# Patient Record
Sex: Female | Born: 1935 | Race: White | Hispanic: No | Smoking: Never smoker
Health system: Southern US, Community
[De-identification: ages and names within clinical notes are randomized; demographics above are authoritative.]

## PROBLEM LIST (undated history)

## (undated) DIAGNOSIS — I639 Cerebral infarction, unspecified: Secondary | ICD-10-CM

## (undated) DIAGNOSIS — I10 Essential (primary) hypertension: Secondary | ICD-10-CM

## (undated) DIAGNOSIS — H269 Unspecified cataract: Secondary | ICD-10-CM

## (undated) DIAGNOSIS — K219 Gastro-esophageal reflux disease without esophagitis: Secondary | ICD-10-CM

## (undated) DIAGNOSIS — F32A Depression, unspecified: Secondary | ICD-10-CM

## (undated) DIAGNOSIS — L409 Psoriasis, unspecified: Secondary | ICD-10-CM

## (undated) DIAGNOSIS — M1990 Unspecified osteoarthritis, unspecified site: Secondary | ICD-10-CM

## (undated) DIAGNOSIS — E785 Hyperlipidemia, unspecified: Secondary | ICD-10-CM

## (undated) DIAGNOSIS — F329 Major depressive disorder, single episode, unspecified: Secondary | ICD-10-CM

## (undated) DIAGNOSIS — E079 Disorder of thyroid, unspecified: Secondary | ICD-10-CM

## (undated) HISTORY — PX: REPLACEMENT TOTAL KNEE: SUR1224

---

## 2004-09-11 ENCOUNTER — Encounter: Payer: Self-pay | Admitting: General Practice

## 2004-09-29 ENCOUNTER — Encounter: Payer: Self-pay | Admitting: General Practice

## 2004-10-10 ENCOUNTER — Ambulatory Visit: Payer: Self-pay | Admitting: Ophthalmology

## 2004-10-14 ENCOUNTER — Ambulatory Visit: Payer: Self-pay | Admitting: Ophthalmology

## 2005-01-15 ENCOUNTER — Ambulatory Visit: Payer: Self-pay | Admitting: Unknown Physician Specialty

## 2005-02-02 ENCOUNTER — Ambulatory Visit: Payer: Self-pay | Admitting: Unknown Physician Specialty

## 2006-01-15 ENCOUNTER — Ambulatory Visit: Payer: Self-pay | Admitting: Unknown Physician Specialty

## 2006-12-09 ENCOUNTER — Ambulatory Visit: Payer: Self-pay | Admitting: Unknown Physician Specialty

## 2007-11-24 ENCOUNTER — Inpatient Hospital Stay: Payer: Self-pay | Admitting: Unknown Physician Specialty

## 2007-11-24 ENCOUNTER — Other Ambulatory Visit: Payer: Self-pay

## 2008-01-19 ENCOUNTER — Encounter: Admission: RE | Admit: 2008-01-19 | Discharge: 2008-01-19 | Payer: Self-pay | Admitting: Surgery

## 2008-04-20 ENCOUNTER — Ambulatory Visit (HOSPITAL_COMMUNITY): Admission: RE | Admit: 2008-04-20 | Discharge: 2008-04-21 | Payer: Self-pay | Admitting: Surgery

## 2008-04-20 ENCOUNTER — Encounter (INDEPENDENT_AMBULATORY_CARE_PROVIDER_SITE_OTHER): Payer: Self-pay | Admitting: Surgery

## 2008-06-27 ENCOUNTER — Inpatient Hospital Stay: Payer: Self-pay | Admitting: Internal Medicine

## 2008-12-05 ENCOUNTER — Ambulatory Visit: Payer: Self-pay | Admitting: Ophthalmology

## 2008-12-18 ENCOUNTER — Ambulatory Visit: Payer: Self-pay | Admitting: Ophthalmology

## 2009-01-03 ENCOUNTER — Ambulatory Visit: Payer: Self-pay | Admitting: Unknown Physician Specialty

## 2010-06-07 ENCOUNTER — Emergency Department: Payer: Self-pay | Admitting: Emergency Medicine

## 2010-12-09 NOTE — Op Note (Signed)
NAMEMISTINA, COATNEY NO.:  [id number]   MEDICAL RECORD NO.:  [id number]          PATIENT TYPE:  AMB   LOCATION:  DAY                          FACILITY:  [facility]   PHYSICIAN:  Velora Heckler, MD      DATE OF BIRTH:  1921-01-16   DATE OF PROCEDURE:  04/20/2008  DATE OF DISCHARGE:                               OPERATIVE REPORT   PREOPERATIVE DIAGNOSES:  1. Thyroid goiter.  2. Hyperthyroidism.   POSTOPERATIVE DIAGNOSES:  1. Thyroid goiter.  2. Hyperthyroidism.   PROCEDURE:  Total thyroidectomy.   SURGEON:  Velora Heckler, M.D., FACS   ASSISTANT:  Ovidio Kin, M.D., FACS  Manus Rudd, M.D., FACS   ANESTHESIA:  General per Dr. Lestine Box.   ESTIMATED BLOOD LOSS:  Minimal.   PREPARATION:  Betadine.   COMPLICATIONS:  None.   INDICATIONS:  The patient is an 77 year old white female from  [place], [place].  Incidental chest x-ray in April 2009 showed  significant tracheal deviation.  CT scan of the chest and [facility] showed a substernal goiter with deviation of the  trachea to the left.  Laboratory studies showed a suppressed TSH level  of 0.219.  The patient was seen by her primary physician and referred to  surgery for evaluation.  Ultrasound examination showed a dominant mass  in the left thyroid lobe measuring 7.3 cm in size.  After discussion  with the patient and family, decision was made to proceed with total  thyroidectomy.   DESCRIPTION OF PROCEDURE:  Procedure is done in OR #6 at the [facility].  The patient is brought to the operating room,  placed in supine position on the operating room table.  Following  administration of general anesthesia, the patient is positioned and then  prepped and draped in usual strict aseptic fashion.  After ascertaining  that an adequate level of anesthesia been achieved, a Kocher incision is  made with a #15 blade.  Dissection is carried through  subcutaneous  tissues and platysma.  Hemostasis is obtained with electrocautery.  Skin  flaps are elevated cephalad and caudad from the thyroid notch to the  sternal notch.  A Mahorner self-retaining retractor is placed for  exposure.  Strap muscles are incised in the midline and dissection is  begun on the left side of the neck.  Left thyroid lobe is goiterous.  It  extends well beneath the clavicle on the left.  It deviates the trachea  to the right.  With gentle blunt dissection, it is mobilized.  Venous  tributaries are divided between medium Ligaclips with the Harmonic  scalpel.  Gland is delivered out of the mediastinum and up into the  neck.  Great care is taken to dissect on the capsule of the gland.  Both  the superior and inferior parathyroid glands are identified and  preserved on their vascular pedicle.  Recurrent nerve is identified and  preserved.  Branches of the inferior thyroid artery are divided between  small and medium Ligaclips with the Harmonic scalpel.  Superior pole  vessels are divided  between medium Ligaclips with the Harmonic scalpel.  Gland is rolled further anteriorly.  Ligament of Allyson Sabal is transected  with electrocautery and the gland is rolled up and onto the anterior  trachea.  Isthmus is mobilized across the midline.  A small pyramidal  lobe is mobilized with electrocautery and included with resection.  A  dry pack is placed in the left neck.   Next we turned our attention to the right side.  Again strap muscles are  reflected laterally.  The right lobe is exposed.  The right thyroid lobe  is normal size but there are two nodules present, one in the superior  pole and one in the inferior pole, each of which measure approximately 1  cm.  Inferior venous tributaries are divided between small Ligaclips  with the Harmonic scalpel.  Superior pole vessels are mobilized and  divided between medium Ligaclips with the Harmonic scalpel.  Gland is  rolled  anteriorly.  Both the superior and inferior parathyroid glands  are identified and preserved on their vascular pedicle.  Branches of the  inferior thyroid artery are divided between small Ligaclips.  Recurrent  nerve is identified and preserved.  Ligament of Allyson Sabal is transected with  electrocautery and the gland is excised off the anterior trachea.  A  suture is used to mark the right superior pole.  The entire specimen is  submitted to pathology for review.  Neck is irrigated with warm saline  which is evacuated.  Good hemostasis is noted.  Surgicel is placed in  the operative field bilaterally.  Strap muscles are reapproximated in  the midline with interrupted 3-0 Vicryl sutures.  Platysma is closed  with interrupted 3-0 Vicryl sutures.  Skin is closed with a running 4-0  Monocryl subcuticular suture.  Wound is washed and dried and Benzoin and  Steri-Strips are applied.  Sterile dressings are applied.  The patient  is awakened from anesthesia and brought the recovery room in stable  condition.  The patient tolerated the procedure well.      Velora Heckler, MD  Electronically Signed     TMG/MEDQ  D:  04/20/2008  T:  04/21/2008  Job:  [id number]   cc:   Francia Greaves, M.D.

## 2011-04-01 ENCOUNTER — Ambulatory Visit: Payer: Self-pay | Admitting: Family Medicine

## 2011-04-27 LAB — URINALYSIS, ROUTINE W REFLEX MICROSCOPIC
Bilirubin Urine: NEGATIVE
Ketones, ur: NEGATIVE
Nitrite: NEGATIVE
Protein, ur: NEGATIVE
Urobilinogen, UA: 0.2

## 2011-04-27 LAB — DIFFERENTIAL
Basophils Absolute: 0.1
Basophils Relative: 1
Eosinophils Absolute: 0.2
Lymphs Abs: 1.5
Neutrophils Relative %: 60

## 2011-04-27 LAB — COMPREHENSIVE METABOLIC PANEL
ALT: 23
CO2: 27
Calcium: 9.2
GFR calc non Af Amer: >60
Glucose, Bld: 106 — ABNORMAL HIGH
Sodium: 139
Total Bilirubin: 0.8

## 2011-04-27 LAB — CBC
Hemoglobin: 13.7
MCHC: 34.5
MCV: 91.8
RBC: 4.33

## 2011-04-27 LAB — PROTIME-INR
INR: 1
Prothrombin Time: 13.2

## 2011-04-27 LAB — CALCIUM
Calcium: 8.3 — ABNORMAL LOW
Calcium: 8.5

## 2015-11-20 ENCOUNTER — Emergency Department: Payer: Medicare Other

## 2015-11-20 ENCOUNTER — Emergency Department
Admission: EM | Admit: 2015-11-20 | Discharge: 2015-11-20 | Disposition: A | Discharge status: Home / Self Care | Payer: Medicare Other | Attending: Emergency Medicine | Admitting: Emergency Medicine

## 2015-11-20 ENCOUNTER — Encounter: Payer: Self-pay | Admitting: Emergency Medicine

## 2015-11-20 DIAGNOSIS — R0602 Shortness of breath: Secondary | ICD-10-CM | POA: Insufficient documentation

## 2015-11-20 DIAGNOSIS — I10 Essential (primary) hypertension: Secondary | ICD-10-CM | POA: Diagnosis not present

## 2015-11-20 DIAGNOSIS — E785 Hyperlipidemia, unspecified: Secondary | ICD-10-CM | POA: Diagnosis not present

## 2015-11-20 DIAGNOSIS — R0600 Dyspnea, unspecified: Secondary | ICD-10-CM

## 2015-11-20 DIAGNOSIS — M549 Dorsalgia, unspecified: Secondary | ICD-10-CM | POA: Insufficient documentation

## 2015-11-20 DIAGNOSIS — M546 Pain in thoracic spine: Secondary | ICD-10-CM

## 2015-11-20 HISTORY — DX: Essential (primary) hypertension: I10

## 2015-11-20 HISTORY — DX: Hyperlipidemia, unspecified: E78.5

## 2015-11-20 HISTORY — DX: Psoriasis, unspecified: L40.9

## 2015-11-20 HISTORY — DX: Gastro-esophageal reflux disease without esophagitis: K21.9

## 2015-11-20 LAB — CBC
HCT: 42.6 % (ref 35.0–47.0)
Hemoglobin: 14.3 g/dL (ref 12.0–16.0)
MCH: 29.5 pg (ref 26.0–34.0)
MCHC: 33.6 g/dL (ref 32.0–36.0)
MCV: 87.9 fL (ref 80.0–100.0)
PLATELETS: 329 10*3/uL (ref 150–440)
RBC: 4.85 MIL/uL (ref 3.80–5.20)
RDW: 13.4 % (ref 11.5–14.5)
WBC: 12.8 10*3/uL — AB (ref 3.6–11.0)

## 2015-11-20 LAB — BASIC METABOLIC PANEL
ANION GAP: 12 (ref 5–15)
BUN: 18 mg/dL (ref 6–20)
CALCIUM: 9.5 mg/dL (ref 8.9–10.3)
CO2: 24 mmol/L (ref 22–32)
CREATININE: 0.8 mg/dL (ref 0.44–1.00)
Chloride: 99 mmol/L — ABNORMAL LOW (ref 101–111)
Glucose, Bld: 112 mg/dL — ABNORMAL HIGH (ref 65–99)
Potassium: 3.5 mmol/L (ref 3.5–5.1)
SODIUM: 135 mmol/L (ref 135–145)

## 2015-11-20 LAB — TROPONIN I: Troponin I: <0.03 ng/mL (ref <0.031)

## 2015-11-20 MED ORDER — CYCLOBENZAPRINE HCL 10 MG PO TABS
ORAL_TABLET | ORAL | Status: AC
  Administered 2015-11-20: 5 mg via ORAL
  Filled 2015-11-20: qty 1

## 2015-11-20 MED ORDER — LORAZEPAM 2 MG/ML IJ SOLN
1.0000 mg | Freq: Once | INTRAMUSCULAR | Status: AC
  Administered 2015-11-20: 1 mg via INTRAVENOUS

## 2015-11-20 MED ORDER — CYCLOBENZAPRINE HCL 10 MG PO TABS
5.0000 mg | ORAL_TABLET | Freq: Once | ORAL | Status: AC
  Administered 2015-11-20: 5 mg via ORAL

## 2015-11-20 MED ORDER — CYCLOBENZAPRINE HCL 5 MG PO TABS: 2.5000 mg | ORAL_TABLET | Freq: Three times a day (TID) | ORAL | Status: AC | PRN

## 2015-11-20 MED ORDER — LORAZEPAM 2 MG/ML IJ SOLN
INTRAMUSCULAR | Status: AC
  Administered 2015-11-20: 1 mg via INTRAVENOUS
  Filled 2015-11-20: qty 1

## 2015-11-20 MED ORDER — IOPAMIDOL (ISOVUE-370) INJECTION 76%
100.0000 mL | Freq: Once | INTRAVENOUS | Status: AC | PRN
  Administered 2015-11-20: 100 mL via INTRAVENOUS

## 2015-11-20 NOTE — ED Provider Notes (Signed)
[facility] Emergency Department Provider Note  Time seen: 4:45 PM  I have reviewed the triage vital signs and the nursing notes.   HISTORY  Chief Complaint Back Pain    HPI Alyssa King is a 82 y.o. female with a past medical history of hypertension, hyperlipidemia, gastric reflux who presents to the emergency department back pain. According to the patient and her daughter the patient has been experiencing significant mid back pain since this morning. Patient states she was attempting to get out of bed when the pain started. Describes the pain as significant. States she had a fall 2 weeks ago but states she has not had any pain until this morning. Denies any recent traumatic events. Upon arriving to the emergency department the patient is also now stating she is having trouble breathing. States it feels like her back is spasming. Denies chest pain. Denies abdominal pain. Denies nausea, vomiting, diaphoresis. Denies fever. Describes her back pain as severe, intermittent comes in waves.     Past Medical History  Diagnosis Date  . Hypertension   . Hyperlipidemia   . GERD (gastroesophageal reflux disease)   . Psoriasis     There are no active problems to display for this patient.   Past Surgical History  Procedure Laterality Date  . Replacement total knee      No current outpatient prescriptions on file.  Allergies Review of patient's allergies indicates no known allergies.  No family history on file.  Social History Social History  Substance Use Topics  . Smoking status: Never Smoker   . Smokeless tobacco: None  . Alcohol Use: Yes    Review of Systems Constitutional: Negative for fever. Cardiovascular: Negative for chest pain. Respiratory: Negative for shortness of breath. Gastrointestinal: Negative for abdominal pain Musculoskeletal: Positive for mid back pain. Skin: Negative for rash. Neurological: Negative for headache 10-point  ROS otherwise negative.  ____________________________________________   PHYSICAL EXAM:  VITAL SIGNS: ED Triage Vitals  Enc Vitals Group     BP 11/20/15 1520 178/89 mmHg     Pulse Rate 11/20/15 1520 90     Resp 11/20/15 1520 13     Temp 11/20/15 1520 98 F (36.7 C)     Temp Source 11/20/15 1520 Oral     SpO2 11/20/15 1520 94 %     Weight 11/20/15 1520 172 lb (78.019 kg)     Height 11/20/15 1520 5' 3.5" (1.613 m)     Head Cir --      Peak Flow --      Pain Score 11/20/15 1527 10     Pain Loc --      Pain Edu? --      Excl. in GC? --     Constitutional: Alert and oriented. Well appearing and in no distress. Eyes: Normal exam ENT   Head: Normocephalic and atraumatic.   Mouth/Throat: Mucous membranes are moist. Cardiovascular: Normal rate, regular rhythm. No murmur Respiratory: Normal respiratory effort without tachypnea nor retractions. Breath sounds are clear Gastrointestinal: Soft and nontender. No distention. No palpable masses. Musculoskeletal: Moderate mid back painWith palpation,  Neurologic:  Normal speech and language. No gross focal neurologic deficits Skin:  Skin is warm, dry and intact.  Psychiatric: Mood and affect are normal. Speech and behavior are normal.  ____________________________________________    EKG  EKG reviewed and interpreted by myself shows normal sinus rhythm at 87 bpm, narrow QRS, mild left axis deviation, nonspecific ST changes. No ST elevations.  ____________________________________________  RADIOLOGY  Chest x-ray shows no acute disease.  ____________________________________________   INITIAL IMPRESSION / ASSESSMENT AND PLAN / ED COURSE  Pertinent labs & imaging results that were available during my care of the patient were reviewed by me and considered in my medical decision making (see chart for details).  Patient presents the emergency department with back pain. Patient was sent for x-rays however upon returning from  x-ray she is complaining of significant shortness of breath, sitting on her side saying she cannot roll onto her back due to back pain and shortness of breath. Daughter states the patient has a history of anxiety. We'll dose 1 mg of Ativan as an anxiolytic hopefully for the patient so we can obtain a CT angiography of the patient's chest abdomen and pelvis to rule out aortic involvement. Patient's labs have resulted within normal limits besides a mild leukocytosis of 12,000. Chest x-ray is negative. Lumbar spine shows possible L5 compression fracture. Patient's pain appears to be in the upper L verse lower T-spine however.  CT angiography does not appear to show any acute abnormalities besides renal artery stenosis but patient has normal renal function. Patient's pain appears to be in the lower thoracic paraspinal region, tenderness to palpation on exam. Patient states he feels better when the area is rubbed. They state they have been doing overhead pulldown's in physical therapy this week which may have been contributory to her discomfort. Patient was able to ambulate with a walker without difficulty. Patient is wishing to be discharged home at this time. We will do a low dose muscle relaxer for the patient she is also to use Tylenol as needed for discomfort she will return to the emergency department for any further shortness of breath, or any worsening of her back pain.     ____________________________________________   FINAL CLINICAL IMPRESSION(S) / ED DIAGNOSES  Back pain Dyspnea   Minna Antis, MD 11/20/15 2015

## 2015-11-20 NOTE — ED Notes (Signed)
Patient transported to CT 

## 2015-11-20 NOTE — ED Notes (Signed)
Pt comes into the ED via EMS from [facility] c/o mid back pain that started this morning.  Patient denies abdominal pain, chest pain, or dizsiness.  Denies any trauma or falls recently. NSR, 194/94, A&Ox4.  Patient is tender upon palpation.

## 2015-11-20 NOTE — Discharge Instructions (Signed)

## 2015-11-24 ENCOUNTER — Encounter: Payer: Self-pay | Admitting: Emergency Medicine

## 2015-11-24 ENCOUNTER — Telehealth: Payer: Self-pay | Admitting: Emergency Medicine

## 2015-11-24 ENCOUNTER — Emergency Department
Admission: EM | Admit: 2015-11-24 | Discharge: 2015-11-24 | Disposition: A | Discharge status: Skilled Nursing Facility | Payer: Medicare Other | Attending: Emergency Medicine | Admitting: Emergency Medicine

## 2015-11-24 DIAGNOSIS — N390 Urinary tract infection, site not specified: Secondary | ICD-10-CM

## 2015-11-24 DIAGNOSIS — I10 Essential (primary) hypertension: Secondary | ICD-10-CM | POA: Diagnosis not present

## 2015-11-24 DIAGNOSIS — M545 Low back pain: Secondary | ICD-10-CM | POA: Diagnosis present

## 2015-11-24 DIAGNOSIS — E785 Hyperlipidemia, unspecified: Secondary | ICD-10-CM | POA: Insufficient documentation

## 2015-11-24 DIAGNOSIS — M546 Pain in thoracic spine: Secondary | ICD-10-CM | POA: Insufficient documentation

## 2015-11-24 DIAGNOSIS — Z79899 Other long term (current) drug therapy: Secondary | ICD-10-CM | POA: Insufficient documentation

## 2015-11-24 LAB — URINALYSIS COMPLETE WITH MICROSCOPIC (ARMC ONLY)
BILIRUBIN URINE: NEGATIVE
Bacteria, UA: NONE SEEN
GLUCOSE, UA: NEGATIVE mg/dL
Ketones, ur: NEGATIVE mg/dL
NITRITE: NEGATIVE
Protein, ur: NEGATIVE mg/dL
SPECIFIC GRAVITY, URINE: 1.016 (ref 1.005–1.030)
pH: 6 (ref 5.0–8.0)

## 2015-11-24 MED ORDER — LIDOCAINE 4 % EX PTCH: 1.0000 | MEDICATED_PATCH | Freq: Two times a day (BID) | CUTANEOUS | Status: DC

## 2015-11-24 MED ORDER — LIDOCAINE 5 % EX PTCH
1.0000 | MEDICATED_PATCH | CUTANEOUS | Status: DC
  Administered 2015-11-24: 1 via TRANSDERMAL
  Filled 2015-11-24 (×2): qty 1

## 2015-11-24 MED ORDER — NITROFURANTOIN MONOHYD MACRO 100 MG PO CAPS
100.0000 mg | ORAL_CAPSULE | Freq: Once | ORAL | Status: AC
  Administered 2015-11-24: 100 mg via ORAL

## 2015-11-24 MED ORDER — LIDOCAINE 5 % EX PTCH: 1.0000 | MEDICATED_PATCH | Freq: Two times a day (BID) | CUTANEOUS | Status: AC

## 2015-11-24 MED ORDER — NITROFURANTOIN MONOHYD MACRO 100 MG PO CAPS: 100.0000 mg | ORAL_CAPSULE | Freq: Two times a day (BID) | ORAL | Status: DC

## 2015-11-24 MED ORDER — NITROFURANTOIN MONOHYD MACRO 100 MG PO CAPS
ORAL_CAPSULE | ORAL | Status: AC
  Filled 2015-11-24: qty 1

## 2015-11-24 NOTE — ED Notes (Signed)
Assisted pt to toilet in room to void and collect urine specimen; pt unable to wipe well before she started to void; pt told this nurse when she arrived she walked just fine; upon arrival of daughter, this RN was informed pt actually uses a walker to ambulate; pt changed to high fall risk, yellow socks in place before getting up to toilet

## 2015-11-24 NOTE — ED Notes (Signed)
[facility]

## 2015-11-24 NOTE — ED Notes (Signed)
[facility] back spasms; seen here 4/26 for same and prescribed flexeril; pt is not sure if she was given any tonight for her spasms; pt says pain goes completely away to 0/10 and then goes up to 10/10 with spasms; denies fall; pt alert and oriented x 3; talking in complete coherent sentences

## 2015-11-24 NOTE — Discharge Instructions (Signed)
1. Take antibiotic as prescribed (Macrobid 100 mg twice daily 7 days). 2. Urine culture is pending. We will notify you if we need to change your antibiotic. 3. You may use Lidoderm term patch as needed for pain control. 4. Return to the ER for worsening symptoms, persistent vomiting, difficulty breathing or other concerns.  Urinary Tract Infection Urinary tract infections (UTIs) can develop anywhere along your urinary tract. Your urinary tract is your body's drainage system for removing wastes and extra water. Your urinary tract includes two kidneys, two ureters, a bladder, and a urethra. Your kidneys are a pair of bean-shaped organs. Each kidney is about the size of your fist. They are located below your ribs, one on each side of your spine. CAUSES Infections are caused by microbes, which are microscopic organisms, including fungi, viruses, and bacteria. These organisms are so small that they can only be seen through a microscope. Bacteria are the microbes that most commonly cause UTIs. SYMPTOMS  Symptoms of UTIs may vary by age and gender of the patient and by the location of the infection. Symptoms in young women typically include a frequent and intense urge to urinate and a painful, burning feeling in the bladder or urethra during urination. Older women and men are more likely to be tired, shaky, and weak and have muscle aches and abdominal pain. A fever may mean the infection is in your kidneys. Other symptoms of a kidney infection include pain in your back or sides below the ribs, nausea, and vomiting. DIAGNOSIS To diagnose a UTI, your caregiver will ask you about your symptoms. Your caregiver will also ask you to provide a urine sample. The urine sample will be tested for bacteria and white blood cells. White blood cells are made by your body to help fight infection. TREATMENT  Typically, UTIs can be treated with medication. Because most UTIs are caused by a bacterial infection, they usually  can be treated with the use of antibiotics. The choice of antibiotic and length of treatment depend on your symptoms and the type of bacteria causing your infection. HOME CARE INSTRUCTIONS  If you were prescribed antibiotics, take them exactly as your caregiver instructs you. Finish the medication even if you feel better after you have only taken some of the medication.  Drink enough water and fluids to keep your urine clear or pale yellow.  Avoid caffeine, tea, and carbonated beverages. They tend to irritate your bladder.  Empty your bladder often. Avoid holding urine for long periods of time.  Empty your bladder before and after sexual intercourse.  After a bowel movement, women should cleanse from front to back. Use each tissue only once. SEEK MEDICAL CARE IF:   You have back pain.  You develop a fever.  Your symptoms do not begin to resolve within 3 days. SEEK IMMEDIATE MEDICAL CARE IF:   You have severe back pain or lower abdominal pain.  You develop chills.  You have nausea or vomiting.  You have continued burning or discomfort with urination. MAKE SURE YOU:   Understand these instructions.  Will watch your condition.  Will get help right away if you are not doing well or get worse.   This information is not intended to replace advice given to you by your health care provider. Make sure you discuss any questions you have with your health care provider.   Document Released: 04/22/2005 Document Revised: 04/03/2015 Document Reviewed: 08/21/2011 Elsevier Interactive Patient Education 2016 Else[employer] Inc[employer]  Back Pain, Adult Back  pain is very common in adults.The cause of back pain is rarely dangerous and the pain often gets better over time.The cause of your back pain may not be known. Some common causes of back pain include:  Strain of the muscles or ligaments supporting the spine.  Wear and tear (degeneration) of the spinal disks.  Arthritis.  Direct injury to  the back. For many people, back pain may return. Since back pain is rarely dangerous, most people can learn to manage this condition on their own. HOME CARE INSTRUCTIONS Watch your back pain for any changes. The following actions may help to lessen any discomfort you are feeling:  Remain active. It is stressful on your back to sit or stand in one place for long periods of time. Do not sit, drive, or stand in one place for more than 30 minutes at a time. Take short walks on even surfaces as soon as you are able.Try to increase the length of time you walk each day.  Exercise regularly as directed by your health care provider. Exercise helps your back heal faster. It also helps avoid future injury by keeping your muscles strong and flexible.  Do not stay in bed.Resting more than 1-2 days can delay your recovery.  Pay attention to your body when you bend and lift. The most comfortable positions are those that put less stress on your recovering back. Always use proper lifting techniques, including:  Bending your knees.  Keeping the load close to your body.  Avoiding twisting.  Find a comfortable position to sleep. Use a firm mattress and lie on your side with your knees slightly bent. If you lie on your back, put a pillow under your knees.  Avoid feeling anxious or stressed.Stress increases muscle tension and can worsen back pain.It is important to recognize when you are anxious or stressed and learn ways to manage it, such as with exercise.  Take medicines only as directed by your health care provider. Over-the-counter medicines to reduce pain and inflammation are often the most helpful.Your health care provider may prescribe muscle relaxant drugs.These medicines help dull your pain so you can more quickly return to your normal activities and healthy exercise.  Apply ice to the injured area:  Put ice in a plastic bag.  Place a towel between your skin and the bag.  Leave the ice on  for 20 minutes, 2-3 times a day for the first 2-3 days. After that, ice and heat may be alternated to reduce pain and spasms.  Maintain a healthy weight. Excess weight puts extra stress on your back and makes it difficult to maintain good posture. SEEK MEDICAL CARE IF:  You have pain that is not relieved with rest or medicine.  You have increasing pain going down into the legs or buttocks.  You have pain that does not improve in one week.  You have night pain.  You lose weight.  You have a fever or chills. SEEK IMMEDIATE MEDICAL CARE IF:   You develop new bowel or bladder control problems.  You have unusual weakness or numbness in your arms or legs.  You develop nausea or vomiting.  You develop abdominal pain.  You feel faint.   This information is not intended to replace advice given to you by your health care provider. Make sure you discuss any questions you have with your health care provider.   Document Released: 07/13/2005 Document Revised: 08/03/2014 Document Reviewed: 11/14/2013 Elsevier Interactive Patient Education [employer]2016 [employer].

## 2015-11-24 NOTE — ED Provider Notes (Signed)
[facility] Emergency Department Provider Note   ____________________________________________  Time seen: Approximately 4:19 AM  I have reviewed the triage vital signs and the nursing notes.   HISTORY  Chief Complaint Back Pain    HPI Alyssa King is a 82 y.o. female who presents to the ED from nursing facility via EMS with a chief complaint of back spasms.Patient was seen in the ED 4 days ago and had multiple imaging studies done for the same complaint which was occurred after intensive physical therapy doing overhead pull downs. She was prescribed Flexeril and she is not sure if she was given any this evening. She was awakened prior to arrival with an intense left sided thoracic back spasm. Denies associated symptoms of fever, chills, chest pain, shortness of breath, abdominal pain, nausea, vomiting, diarrhea. Nothing makes her symptoms better or worse.   Past Medical History  Diagnosis Date  . Hypertension   . Hyperlipidemia   . GERD (gastroesophageal reflux disease)   . Psoriasis     There are no active problems to display for this patient.   Past Surgical History  Procedure Laterality Date  . Replacement total knee      Current Outpatient Rx  Name  Route  Sig  Dispense  Refill  . acetaminophen (TYLENOL) 325 MG tablet   Oral   Take 650 mg by mouth 2 (two) times daily. Pt is also able to take twice daily as needed for pain in between scheduled doses.         . ALPRAZolam (XANAX) 0.25 MG tablet   Oral   Take 0.125 mg by mouth every 8 (eight) hours as needed for anxiety.         Marland Kitchen amLODipine (NORVASC) 5 MG tablet   Oral   Take 5 mg by mouth daily.         . benazepril (LOTENSIN) 10 MG tablet   Oral   Take 10 mg by mouth daily.         . calcium carbonate (TUMS - DOSED IN MG ELEMENTAL CALCIUM) 500 MG chewable tablet   Oral   Chew 2 tablets by mouth daily.         . cholecalciferol (VITAMIN D) 1000 units tablet  Oral   Take 2,000 Units by mouth daily.         . cyclobenzaprine (FLEXERIL) 5 MG tablet   Oral   Take 0.5 tablets (2.5 mg total) by mouth every 8 (eight) hours as needed for muscle spasms.   20 tablet   0   . diclofenac sodium (VOLTAREN) 1 % GEL   Topical   Apply 2 g topically 2 (two) times daily. Pt applies to both legs.         Marland Kitchen escitalopram (LEXAPRO) 10 MG tablet   Oral   Take 10 mg by mouth daily.         Marland Kitchen levothyroxine (SYNTHROID, LEVOTHROID) 100 MCG tablet   Oral   Take 100 mcg by mouth every evening.         . loratadine (CLARITIN) 10 MG tablet   Oral   Take 10 mg by mouth daily as needed for allergies.         . potassium chloride (K-DUR,KLOR-CON) 10 MEQ tablet   Oral   Take 10 mEq by mouth daily.         . ranitidine (ZANTAC) 150 MG tablet   Oral   Take 150 mg by mouth at  bedtime.         . triamterene-hydrochlorothiazide (MAXZIDE-25) 37.5-25 MG tablet   Oral   Take 0.5 tablets by mouth daily.         . vitamin B-12 (CYANOCOBALAMIN) 1000 MCG tablet   Oral   Take 1,000 mcg by mouth every Monday, Wednesday, and Friday.           Allergies Review of patient's allergies indicates no known allergies.  History reviewed. No pertinent family history.  Social History Social History  Substance Use Topics  . Smoking status: Never Smoker   . Smokeless tobacco: None  . Alcohol Use: No    Review of Systems  Constitutional: No fever/chills. Eyes: No visual changes. ENT: No sore throat. Cardiovascular: Denies chest pain. Respiratory: Denies shortness of breath. Gastrointestinal: No abdominal pain.  No nausea, no vomiting.  No diarrhea.  No constipation. Genitourinary: Negative for dysuria. Musculoskeletal: Positive for back pain. Skin: Negative for rash. Neurological: Negative for headaches, focal weakness or numbness.  10-point ROS otherwise negative.  ____________________________________________   PHYSICAL EXAM:  VITAL  SIGNS: ED Triage Vitals  Enc Vitals Group     BP 11/24/15 0333 150/77 mmHg     Pulse Rate 11/24/15 0333 85     Resp 11/24/15 0333 18     Temp 11/24/15 0333 97.9 F (36.6 C)     Temp Source 11/24/15 0333 Oral     SpO2 11/24/15 0333 95 %     Weight 11/24/15 0333 161 lb (73.029 kg)     Height 11/24/15 0333 5' 3.5" (1.613 m)     Head Cir --      Peak Flow --      Pain Score 11/24/15 0335 10     Pain Loc --      Pain Edu? --      Excl. in GC? --     Constitutional: Alert and oriented. Well appearing and in no acute distress. Eyes: Conjunctivae are normal. PERRL. EOMI. Head: Atraumatic. Nose: No congestion/rhinnorhea. Mouth/Throat: Mucous membranes are moist.  Oropharynx non-erythematous. Neck: No stridor.   Cardiovascular: Normal rate, regular rhythm. Grossly normal heart sounds.  Good peripheral circulation. Respiratory: Normal respiratory effort.  No retractions. Lungs CTAB. Gastrointestinal: Soft and nontender. No distention. No abdominal bruits. No CVA tenderness. Musculoskeletal: Left posterior thoracic back tender to palpation with muscle spasms. No lower extremity tenderness nor edema.  No joint effusions. Neurologic:  Normal speech and language. No gross focal neurologic deficits are appreciated. No gait instability. Skin:  Skin is warm, dry and intact. No rash noted. Psychiatric: Mood and affect are normal. Speech and behavior are normal.  ____________________________________________   LABS (all labs ordered are listed, but only abnormal results are displayed)  Labs Reviewed  URINALYSIS COMPLETEWITH MICROSCOPIC ([facility] ONLY) - Abnormal; Notable for the following:    Color, Urine YELLOW (*)    APPearance CLEAR (*)    Hgb urine dipstick 1+ (*)    Leukocytes, UA 2+ (*)    Squamous Epithelial / LPF 0-5 (*)    All other components within normal limits  URINE CULTURE    ____________________________________________  EKG  None ____________________________________________  RADIOLOGY  None ____________________________________________   PROCEDURES  Procedure(s) performed: None  Critical Care performed: No  ____________________________________________   INITIAL IMPRESSION / ASSESSMENT AND PLAN / ED COURSE  Pertinent labs & imaging results that were available during my care of the patient were reviewed by me and considered in my medical decision making (see chart for details).  82 year old female who returns to the emergency department for left thoracic muscle spasms. Prior ED chart reviewed; will obtain urinalysis.  ----------------------------------------- 5:56 AM on 11/24/2015 -----------------------------------------  Patient is much improved, smiling. Updated patient and family members of urinalysis result. Will place on Macrobid, Lidoderm patch as needed and follow-up with her PCP next week. Strict return precautions given. All verbalize understanding and agree with plan of care. ____________________________________________   FINAL CLINICAL IMPRESSION(S) / ED DIAGNOSES  Final diagnoses:  Left-sided thoracic back pain  UTI (lower urinary tract infection)      NEW MEDICATIONS STARTED DURING THIS VISIT:  New Prescriptions   No medications on file     Note:  This document was prepared using Dragon voice recognition software and may include unintentional dictation errors.    Irean HongJade J Jaylyne Breese, MD 11/24/15 [phone]

## 2015-11-24 NOTE — ED Notes (Signed)
After getting back into bed from using toilet pt says pain radiates across mid back;

## 2015-11-26 LAB — URINE CULTURE: SPECIAL REQUESTS: NORMAL

## 2017-06-19 ENCOUNTER — Inpatient Hospital Stay: Payer: Medicare Other

## 2017-06-19 ENCOUNTER — Other Ambulatory Visit: Payer: Self-pay

## 2017-06-19 ENCOUNTER — Emergency Department: Payer: Medicare Other

## 2017-06-19 ENCOUNTER — Inpatient Hospital Stay
Admission: EM | Admit: 2017-06-19 | Discharge: 2017-06-20 | DRG: 065 | Disposition: A | Discharge status: Skilled Nursing Facility | Payer: Medicare Other | Attending: Internal Medicine | Admitting: Internal Medicine

## 2017-06-19 DIAGNOSIS — E785 Hyperlipidemia, unspecified: Secondary | ICD-10-CM | POA: Diagnosis not present

## 2017-06-19 DIAGNOSIS — G8194 Hemiplegia, unspecified affecting left nondominant side: Secondary | ICD-10-CM | POA: Diagnosis present

## 2017-06-19 DIAGNOSIS — Z96659 Presence of unspecified artificial knee joint: Secondary | ICD-10-CM | POA: Diagnosis not present

## 2017-06-19 DIAGNOSIS — K219 Gastro-esophageal reflux disease without esophagitis: Secondary | ICD-10-CM | POA: Diagnosis present

## 2017-06-19 DIAGNOSIS — E039 Hypothyroidism, unspecified: Secondary | ICD-10-CM | POA: Diagnosis not present

## 2017-06-19 DIAGNOSIS — Z66 Do not resuscitate: Secondary | ICD-10-CM | POA: Diagnosis not present

## 2017-06-19 DIAGNOSIS — Z79899 Other long term (current) drug therapy: Secondary | ICD-10-CM | POA: Diagnosis not present

## 2017-06-19 DIAGNOSIS — R29704 NIHSS score 4: Secondary | ICD-10-CM | POA: Diagnosis not present

## 2017-06-19 DIAGNOSIS — I639 Cerebral infarction, unspecified: Principal | ICD-10-CM

## 2017-06-19 DIAGNOSIS — I10 Essential (primary) hypertension: Secondary | ICD-10-CM

## 2017-06-19 DIAGNOSIS — R29810 Facial weakness: Secondary | ICD-10-CM | POA: Diagnosis not present

## 2017-06-19 DIAGNOSIS — R269 Unspecified abnormalities of gait and mobility: Secondary | ICD-10-CM

## 2017-06-19 HISTORY — DX: Cerebral infarction, unspecified: I63.9

## 2017-06-19 HISTORY — DX: Major depressive disorder, single episode, unspecified: F32.9

## 2017-06-19 HISTORY — DX: Unspecified osteoarthritis, unspecified site: M19.90

## 2017-06-19 HISTORY — DX: Unspecified cataract: H26.9

## 2017-06-19 HISTORY — DX: Disorder of thyroid, unspecified: E07.9

## 2017-06-19 HISTORY — DX: Depression, unspecified: F32.A

## 2017-06-19 LAB — BASIC METABOLIC PANEL
Anion gap: 10 (ref 5–15)
BUN: 17 mg/dL (ref 6–20)
CHLORIDE: 100 mmol/L — AB (ref 101–111)
CO2: 25 mmol/L (ref 22–32)
CREATININE: 0.72 mg/dL (ref 0.44–1.00)
Calcium: 8.1 mg/dL — ABNORMAL LOW (ref 8.9–10.3)
GFR calc Af Amer: >60 mL/min (ref >60)
GLUCOSE: 137 mg/dL — AB (ref 65–99)
Potassium: 3.3 mmol/L — ABNORMAL LOW (ref 3.5–5.1)
SODIUM: 135 mmol/L (ref 135–145)

## 2017-06-19 LAB — URINALYSIS, COMPLETE (UACMP) WITH MICROSCOPIC
Bilirubin Urine: NEGATIVE
Glucose, UA: NEGATIVE mg/dL
Hgb urine dipstick: NEGATIVE
KETONES UR: NEGATIVE mg/dL
Leukocytes, UA: NEGATIVE
Nitrite: NEGATIVE
PH: 7 (ref 5.0–8.0)
Protein, ur: NEGATIVE mg/dL
SPECIFIC GRAVITY, URINE: 1.014 (ref 1.005–1.030)

## 2017-06-19 LAB — CBC
HCT: 39.9 % (ref 35.0–47.0)
Hemoglobin: 13.7 g/dL (ref 12.0–16.0)
MCH: 29.9 pg (ref 26.0–34.0)
MCHC: 34.3 g/dL (ref 32.0–36.0)
MCV: 87.2 fL (ref 80.0–100.0)
PLATELETS: 309 10*3/uL (ref 150–440)
RBC: 4.58 MIL/uL (ref 3.80–5.20)
RDW: 13.3 % (ref 11.5–14.5)
WBC: 9.5 10*3/uL (ref 3.6–11.0)

## 2017-06-19 LAB — PROTIME-INR
INR: 1.01
Prothrombin Time: 13.2 seconds (ref 11.4–15.2)

## 2017-06-19 LAB — MRSA PCR SCREENING: MRSA by PCR: NEGATIVE

## 2017-06-19 LAB — APTT: APTT: 27 s (ref 24–36)

## 2017-06-19 LAB — TROPONIN I: Troponin I: <0.03 ng/mL (ref <0.03)

## 2017-06-19 LAB — GLUCOSE, CAPILLARY: Glucose-Capillary: 125 mg/dL — ABNORMAL HIGH (ref 65–99)

## 2017-06-19 MED ORDER — CLOPIDOGREL BISULFATE 75 MG PO TABS
75.0000 mg | ORAL_TABLET | Freq: Every day | ORAL | Status: DC
  Administered 2017-06-20: 75 mg via ORAL
  Filled 2017-06-19: qty 1

## 2017-06-19 MED ORDER — ONDANSETRON HCL 4 MG/2ML IJ SOLN: 4.0000 mg | Freq: Four times a day (QID) | INTRAMUSCULAR | Status: DC | PRN

## 2017-06-19 MED ORDER — HEPARIN SODIUM (PORCINE) 5000 UNIT/ML IJ SOLN
5000.0000 [IU] | Freq: Three times a day (TID) | INTRAMUSCULAR | Status: DC
  Administered 2017-06-19 – 2017-06-20 (×2): 5000 [IU] via SUBCUTANEOUS
  Filled 2017-06-19 (×2): qty 1

## 2017-06-19 MED ORDER — DICLOFENAC SODIUM 1 % TD GEL
2.0000 g | Freq: Two times a day (BID) | TRANSDERMAL | Status: DC
  Administered 2017-06-20: 10:00:00 2 g via TOPICAL
  Filled 2017-06-19: qty 100

## 2017-06-19 MED ORDER — POTASSIUM CHLORIDE IN NACL 40-0.9 MEQ/L-% IV SOLN
INTRAVENOUS | Status: DC
  Administered 2017-06-19 – 2017-06-20 (×2): 75 mL/h via INTRAVENOUS
  Filled 2017-06-19 (×3): qty 1000

## 2017-06-19 MED ORDER — ATORVASTATIN CALCIUM 20 MG PO TABS
10.0000 mg | ORAL_TABLET | Freq: Every day | ORAL | Status: DC
  Administered 2017-06-19: 18:00:00 10 mg via ORAL
  Filled 2017-06-19: qty 1

## 2017-06-19 MED ORDER — ESCITALOPRAM OXALATE 10 MG PO TABS
10.0000 mg | ORAL_TABLET | Freq: Every day | ORAL | Status: DC
  Administered 2017-06-20: 10 mg via ORAL
  Filled 2017-06-19: qty 1

## 2017-06-19 MED ORDER — DOCUSATE SODIUM 100 MG PO CAPS
100.0000 mg | ORAL_CAPSULE | Freq: Two times a day (BID) | ORAL | Status: DC
  Administered 2017-06-20: 100 mg via ORAL
  Filled 2017-06-19 (×2): qty 1

## 2017-06-19 MED ORDER — PANTOPRAZOLE SODIUM 40 MG PO TBEC
40.0000 mg | DELAYED_RELEASE_TABLET | Freq: Every day | ORAL | Status: DC
  Administered 2017-06-20: 10:00:00 40 mg via ORAL
  Filled 2017-06-19: qty 1

## 2017-06-19 MED ORDER — VITAMIN D 1000 UNITS PO TABS
2000.0000 [IU] | ORAL_TABLET | Freq: Every day | ORAL | Status: DC
  Administered 2017-06-20: 2000 [IU] via ORAL
  Filled 2017-06-19: qty 2

## 2017-06-19 MED ORDER — BISACODYL 10 MG RE SUPP: 10.0000 mg | Freq: Every day | RECTAL | Status: DC | PRN

## 2017-06-19 MED ORDER — ASPIRIN 81 MG PO CHEW
324.0000 mg | CHEWABLE_TABLET | Freq: Once | ORAL | Status: AC
  Administered 2017-06-19: 324 mg via ORAL
  Filled 2017-06-19: qty 4

## 2017-06-19 MED ORDER — ONDANSETRON HCL 4 MG PO TABS: 4.0000 mg | ORAL_TABLET | Freq: Four times a day (QID) | ORAL | Status: DC | PRN

## 2017-06-19 MED ORDER — ACETAMINOPHEN 650 MG RE SUPP: 650.0000 mg | Freq: Four times a day (QID) | RECTAL | Status: DC | PRN

## 2017-06-19 MED ORDER — ACETAMINOPHEN 325 MG PO TABS
650.0000 mg | ORAL_TABLET | Freq: Four times a day (QID) | ORAL | Status: DC | PRN
  Administered 2017-06-19 – 2017-06-20 (×2): 650 mg via ORAL
  Filled 2017-06-19 (×2): qty 2

## 2017-06-19 MED ORDER — FAMOTIDINE 20 MG PO TABS
20.0000 mg | ORAL_TABLET | Freq: Every day | ORAL | Status: DC
  Administered 2017-06-19: 20 mg via ORAL
  Filled 2017-06-19: qty 1

## 2017-06-19 MED ORDER — BENAZEPRIL HCL 10 MG PO TABS
10.0000 mg | ORAL_TABLET | Freq: Every day | ORAL | Status: DC
  Administered 2017-06-20: 10:00:00 10 mg via ORAL
  Filled 2017-06-19 (×2): qty 1

## 2017-06-19 MED ORDER — TRIAMTERENE-HCTZ 37.5-25 MG PO TABS
0.5000 | ORAL_TABLET | Freq: Every day | ORAL | Status: DC
  Administered 2017-06-20: 0.5 via ORAL
  Filled 2017-06-19: qty 0.5

## 2017-06-19 MED ORDER — AMLODIPINE BESYLATE 5 MG PO TABS
5.0000 mg | ORAL_TABLET | Freq: Every day | ORAL | Status: DC
  Administered 2017-06-20: 5 mg via ORAL
  Filled 2017-06-19: qty 1

## 2017-06-19 NOTE — Consult Note (Signed)
[facility] TeleNeurology Consult Services  Asked to see this patient in telemedicine consultation. Consultation was performed with assistance of ancillary/medical staff at bedside.  Comments: Last Known normal ? 900 Door Time: 1102 [facility] Contacted: 1127 [facility] first log in: 1135 NIHSS assessment time: 1148 Needle Time: no iv tpa Call back time: 1149  HPI: [age] yof with no prior hx of stroke lives in ALF and was noted to have left sided weakness this am. She felt different when she woke up and staff didn't notice any weakness until she was attempting to get up out of her chair. She was noted to be leaning left and having difficulty walking without assistance. She is not on any a/c, has no past hx of GI/GU bleeding. Ct scan head with evidence of acute R basal ganglia stroke per radiology report.    VSS  Gen Wn/Wd in Nad  TeleStroke Assessment: LOC:   0 LOC questions:  0 LOC Commands :   0 Gaze : 0 Visual fields :  0  Facial movements : 1 Upper limb Motor 1 Lower limb Motor  1 Limb Coordination  - 0 Sensory -  - 1 Language -  0 Speech -   0 Neglect / extinction -  0  NIHSS Score: 4    IMPRESSION  R Basal ganglia stroke  Blood Pressure and Blood Glucose within acceptable parameters.   Medical Decision Making:   Patient is not candidate for alteplase due to possible onset greater than 4.5hrs with changes noted on ct scan and patient with nonspecific complaint of not feeling normal when she woke up.   Not an IR candidate as low clinical suspicion for LVO by neurologic assessment.   Recommendations: 1- Daily antithrombotics to initiate now if no contraindication.  2- Further work up with Stroke labs, MRI brain, ECHO, NIVS Carotid will be deferred to inpt neurology service 3-  Needs Inpatient Neurology consultation and follow up  4- Thank you for allowing us to participate in the care of your patient, if there are any questions please don't  hesitate to contact us  Discussed plan of care with patient/family/hospital staff   Physician: Terrace ArabiaMohammed Dayleen Beske, DO   [facility]

## 2017-06-19 NOTE — ED Notes (Signed)
Called 2A to give report, bed now being reassigned to 1C

## 2017-06-19 NOTE — H&P (Signed)
History and Physical    Alyssa King ZOX:[id number] DOB: 20-Jun-1921 DOA: 06/19/2017  Referring physician: Dr. Lenard Lance PCP: [facility]  Specialists: none  Chief Complaint: weakness  HPI: Alyssa King is a 83 y.o. female has a past medical history significant for HTN and HLD living at an ALF who woke up this AM with Left LE weakness and left facial droop. Could not walk. Was brought to ER where CT shows acute basal ganglion infarct with no bleed. Sx's are improving. She is now admitted. Denies cardiac or stroke hx. No issues with UE's, speech, or swallowing  Review of Systems: The patient denies anorexia, fever, weight loss,, vision loss, decreased hearing, hoarseness, chest pain, syncope, dyspnea on exertion, peripheral edema, balance deficits, hemoptysis, abdominal pain, melena, hematochezia, severe indigestion/heartburn, hematuria, incontinence, genital sores, muscle weakness, suspicious skin lesions, transient blindness,  depression, unusual weight change, abnormal bleeding, enlarged lymph nodes, angioedema, and breast masses.   Past Medical History:  Diagnosis Date  . GERD (gastroesophageal reflux disease)   . Hyperlipidemia   . Hypertension   . Psoriasis    Past Surgical History:  Procedure Laterality Date  . REPLACEMENT TOTAL KNEE     Social History:  reports that  has never smoked. she has never used smokeless tobacco. She reports that she does not drink alcohol or use drugs.  No Known Allergies  History reviewed. No pertinent family history.  Prior to Admission medications   Medication Sig Start Date End Date Taking? Authorizing Provider  acetaminophen (TYLENOL) 325 MG tablet Take 650 mg by mouth 2 (two) times daily. Pt is also able to take twice daily as needed for pain in between scheduled doses.   Yes [provider]  amLODipine (NORVASC) 5 MG tablet Take 5 mg by mouth daily.   Yes [provider]  benazepril (LOTENSIN)  10 MG tablet Take 10 mg by mouth daily.   Yes [provider]  cholecalciferol (VITAMIN D) 1000 units tablet Take 2,000 Units by mouth daily.   Yes [provider]  diclofenac sodium (VOLTAREN) 1 % GEL Apply 2 g topically 2 (two) times daily. Pt applies to both legs.   Yes [provider]  escitalopram (LEXAPRO) 10 MG tablet Take 10 mg by mouth daily.   Yes [provider]  levothyroxine (SYNTHROID, LEVOTHROID) 75 MCG tablet Take 75 mcg by mouth every evening.    Yes [provider]  Omega-3 Fatty Acids (FISH OIL) 1000 MG CAPS Take 1,000 mg by mouth daily.   Yes [provider]  ranitidine (ZANTAC) 150 MG tablet Take 150 mg by mouth at bedtime.   Yes [provider]  triamterene-hydrochlorothiazide (MAXZIDE-25) 37.5-25 MG tablet Take 0.5 tablets by mouth daily.   Yes [provider]  vitamin B-12 (CYANOCOBALAMIN) 1000 MCG tablet Take 1,000 mcg by mouth every Monday, Wednesday, and Friday.   Yes [provider]  Lidocaine (ASPERCREME LIDOCAINE) 4 % PTCH Apply 1 patch topically 2 (two) times daily. Apply patch for 12 hours and then discard. A new patch may be applied every 12 hours. Patient not taking: Reported on 06/19/2017 11/24/15   Minna Antis, MD   Physical Exam: Vitals:   06/19/17 1106 06/19/17 1107  BP: (!) 144/89   Pulse: 73   Resp: 16   Temp: 97.7 F (36.5 C)   TempSrc: Oral   SpO2: 95%   Weight:  83.4 kg (183 lb 12.8 oz)  Height:  5\' 3"  (1.6 m)  General:  No apparent distress, WDWN, [place]/AT  Eyes: PERRL, EOMI, no scleral icterus, conjunctiva clear  ENT: moist oropharynx without exudate, TM's benign, dentition fair  Neck: supple, no lymphadenopathy. No bruits or thyromegaly  Cardiovascular: regular rate without MRG; 2+ peripheral pulses, no JVD, no peripheral edema  Respiratory: CTA biL, good air movement without wheezing, rhonchi or crackled. Respiratory effort normal  Abdomen: soft,  non tender to palpation, positive bowel sounds, no guarding, no rebound  Skin: no rashes or lesions  Musculoskeletal: normal bulk and tone, no joint swelling  Psychiatric: normal mood and affect, A&OX3  Neurologic: CN 2-12 grossly intact, Motor strength 5/5 in all 4 groups with symmetric DTR's and non-focal sensory exam  Labs on Admission:  Basic Metabolic Panel: Recent Labs  Lab 06/19/17 1105  NA 135  K 3.3*  CL 100*  CO2 25  GLUCOSE 137*  BUN 17  CREATININE 0.72  CALCIUM 8.1*   Liver Function Tests: No results for input(s): AST, ALT, ALKPHOS, BILITOT, PROT, ALBUMIN in the last 168 hours. No results for input(s): LIPASE, AMYLASE in the last 168 hours. No results for input(s): AMMONIA in the last 168 hours. CBC: Recent Labs  Lab 06/19/17 1105  WBC 9.5  HGB 13.7  HCT 39.9  MCV 87.2  PLT 309   Cardiac Enzymes: No results for input(s): CKTOTAL, CKMB, CKMBINDEX, TROPONINI in the last 168 hours.  BNP (last 3 results) No results for input(s): BNP in the last 8760 hours.  ProBNP (last 3 results) No results for input(s): PROBNP in the last 8760 hours.  CBG: Recent Labs  Lab 06/19/17 1135  GLUCAP 125*    Radiological Exams on Admission: Ct Head Code Stroke Wo Contrast  Result Date: 06/19/2017 CLINICAL DATA:  Code stroke.  Left-sided weakness.  Facial droop. EXAM: CT HEAD WITHOUT CONTRAST TECHNIQUE: Contiguous axial images were obtained from the base of the skull through the vertex without intravenous contrast. COMPARISON:  Brain MRI 04/01/2011 FINDINGS: Brain: Small chronic infarcts are again seen in the cerebellum and right corona radiata. Periventricular white matter hypodensities bilaterally are nonspecific but compatible with mild chronic small vessel ischemic disease. Cerebral atrophy is not greater than expected for age. There is patchy low density anteriorly in the right lentiform nucleus, which was normal on the prior MRI and on an older CT from 06/07/2010.  There may also be involvement of the anterior limb of the right internal capsule. There is no evidence of acute cortically based infarct, intracranial hemorrhage, mass, midline shift, or extra-axial fluid collection. Vascular: Calcified atherosclerosis at the skullbase. No hyperdense vessel. Skull: No fracture or focal osseous lesion. Sinuses/Orbits: Visualized paranasal sinuses and mastoid air cells are clear. Bilateral cataract extraction. Other: None. ASPECTS ([place] Stroke Program Early CT Score) - Ganglionic level infarction (caudate, lentiform nuclei, internal capsule, insula, M1-M3 cortex): 5-6 (lentiform nucleus and possible internal capsule involvement) - Supraganglionic infarction (M4-M6 cortex): 3 Total score (0-10 with 10 being normal): 8-9 IMPRESSION: 1. Acute right basal ganglia infarct.  No hemorrhage. 2. ASPECTS is 8-9. 3. Mild chronic small vessel ischemic disease. These results were called by telephone at the time of interpretation on 06/19/2017 at 11:43 am to Dr. Minna AntisKEVIN PADUCHOWSKI , who verbally acknowledged these results. Electronically Signed   By: Sebastian AcheAllen  Grady M.D.   On: 06/19/2017 11:46    EKG: Independently reviewed.  Assessment/Plan Principal Problem:   CVA (cerebral vascular accident) (HCC) Active Problems:   HTN, goal below 140/80   Gait disturbance   HLD (hyperlipidemia)  Will observe on floor with telemetry and IV fluids. Begin Plavix. Echo and carotids ordered. Consult Neurology, PT and CSW. Repeat labs in AM  Diet: soft Fluids: NS with K+@75  DVT Prophylaxis: SQ Heparin  Code Status: FULL  Family Communication: yes  Disposition Plan: ALF  Time spent: 50 min

## 2017-06-19 NOTE — ED Triage Notes (Signed)
[facility] Per staff, left sided weakness. Pt was assisted to floor, no fall. Pt has left sided facial droop, however, per pt, this is not new. Grips equal in strength. Pt alert and oriented, following all commands appropriately.

## 2017-06-19 NOTE — ED Notes (Signed)
Family came and reports this facial droop is new

## 2017-06-19 NOTE — ED Notes (Signed)
Tried calling report, both primary nurse and charge nurse unavailable at this time.

## 2017-06-19 NOTE — ED Provider Notes (Signed)
[facility] Emergency Department Provider Note  Time seen: 11:35 AM  I have reviewed the triage vital signs and the nursing notes.   HISTORY  Chief Complaint Weakness    HPI Alyssa King is a 83 y.o. female with a past medical history of gastric reflux, hypertension, hyperlipidemia presents to the emergency department with acute onset of left facial droop and difficulty speaking.  According to EMS report per nursing home staff the patient ate breakfast was able to go back to her room.  Attempted to stand up around 1015 and could not do so so she called the nurse.  The nurse noted her to have a left facial droop and slurring her speech so she called EMS.  EMS reports the patient states the facial droop is chronic so no code stroke was initiated.  The family is here with the patient upon her arrival and they state that the facial droop is new.  Code stroke initiated upon arrival.  Past Medical History:  Diagnosis Date  . GERD (gastroesophageal reflux disease)   . Hyperlipidemia   . Hypertension   . Psoriasis     There are no active problems to display for this patient.   Past Surgical History:  Procedure Laterality Date  . REPLACEMENT TOTAL KNEE      Prior to Admission medications   Medication Sig Start Date End Date Taking? Authorizing Provider  acetaminophen (TYLENOL) 325 MG tablet Take 650 mg by mouth 2 (two) times daily. Pt is also able to take twice daily as needed for pain in between scheduled doses.   Yes [provider]  amLODipine (NORVASC) 5 MG tablet Take 5 mg by mouth daily.   Yes [provider]  benazepril (LOTENSIN) 10 MG tablet Take 10 mg by mouth daily.   Yes [provider]  cholecalciferol (VITAMIN D) 1000 units tablet Take 2,000 Units by mouth daily.   Yes [provider]  diclofenac sodium (VOLTAREN) 1 % GEL Apply 2 g topically 2 (two) times daily. Pt applies to both legs.   Yes [provider]  escitalopram (LEXAPRO) 10 MG tablet Take 10 mg by mouth daily.   Yes [provider]  levothyroxine (SYNTHROID, LEVOTHROID) 75 MCG tablet Take 75 mcg by mouth every evening.    Yes [provider]  Omega-3 Fatty Acids (FISH OIL) 1000 MG CAPS Take 1,000 mg by mouth daily.   Yes [provider]  ranitidine (ZANTAC) 150 MG tablet Take 150 mg by mouth at bedtime.   Yes [provider]  triamterene-hydrochlorothiazide (MAXZIDE-25) 37.5-25 MG tablet Take 0.5 tablets by mouth daily.   Yes [provider]  vitamin B-12 (CYANOCOBALAMIN) 1000 MCG tablet Take 1,000 mcg by mouth every Monday, Wednesday, and Friday.   Yes [provider]  Lidocaine (ASPERCREME LIDOCAINE) 4 % PTCH Apply 1 patch topically 2 (two) times daily. Apply patch for 12 hours and then discard. A new patch may be applied every 12 hours. Patient not taking: Reported on 06/19/2017 11/24/15   Minna AntisPaduchowski, Fender Herder, MD    No Known Allergies  No family history on file.  Social History Social History   Tobacco Use  . Smoking status: Never Smoker  . Smokeless tobacco: Never Used  Substance Use Topics  . Alcohol use: No  . Drug use: No    Review of Systems Constitutional: Negative for fever. Cardiovascular: Negative for chest pain. Respiratory: Negative for shortness of breath. Gastrointestinal: Negative for abdominal pain Musculoskeletal: Negative  for back pain. Neurological: Negative for headache All other ROS negative  ____________________________________________   PHYSICAL EXAM:  VITAL SIGNS: ED Triage Vitals  Enc Vitals Group     BP 06/19/17 1106 (!) 144/89     Pulse Rate 06/19/17 1106 73     Resp 06/19/17 1106 16     Temp 06/19/17 1106 97.7 F (36.5 C)     Temp Source 06/19/17 1106 Oral     SpO2 06/19/17 1106 95 %     Weight 06/19/17 1107 183 lb 12.8 oz (83.4 kg)     Height 06/19/17 1107 5\' 3"  (1.6 m)     Head Circumference --      Peak Flow  --      Pain Score --      Pain Loc --      Pain Edu? --      Excl. in GC? --    Constitutional: Alert. Well appearing and in no distress. Eyes: Normal exam ENT   Head: Normocephalic and atraumatic.   Mouth/Throat: Mucous membranes are moist. Cardiovascular: Normal rate, regular rhythm. Respiratory: Normal respiratory effort without tachypnea nor retractions. Breath sounds are clear  Gastrointestinal: Soft and nontender. No distention. Musculoskeletal: Nontender with normal range of motion in all extremities.  Neurologic: Moderate left facial droop, slight slurred speech.  Patient has a mild left pronator drift, but equal grip strengths, equal strength in bilateral lower extremities without lower extremity drift. Skin:  Skin is warm, dry and intact.  Psychiatric: Mood and affect are normal.   ____________________________________________    EKG  EKG reviewed and interpreted by myself shows normal sinus rhythm at 73 bpm, narrow QRS, left axis deviation, normal intervals with no concerning ST changes.  ____________________________________________    RADIOLOGY  CT head consistent with acute infarct in the right basal ganglia per radiologist.  ____________________________________________   INITIAL IMPRESSION / ASSESSMENT AND PLAN / ED COURSE  Pertinent labs & imaging results that were available during my care of the patient were reviewed by me and considered in my medical decision making (see chart for details).  Patient presents to the emergency department with acute left facial droop at 10:15 AM.  Family confirms that this is new.  Code stroke was initiated as the patient is within the TPA window.  Differential would include CVA, ischemic CVA, hemorrhagic CVA, TIA, Bell's palsy.  On my examination the patient has a moderate left facial droop.  Patient has equal grip strengths but a slight left upper pronator drift.  NIH Stroke Scale   Interval: Baseline Time: 12:01  PM Person Administering Scale: Rawad Bochicchio  Administer stroke scale items in the order listed. Record performance in each category after each subscale exam. Do not go back and change scores. Follow directions provided for each exam technique. Scores should reflect what the patient does, not what the clinician thinks the patient can do. The clinician should record answers while administering the exam and work quickly. Except where indicated, the patient should not be coached (i.e., repeated requests to patient to make a special effort).   1a  Level of consciousness: 0=alert; keenly responsive  1b. LOC questions:  0=Performs both tasks correctly  1c. LOC commands: 0=Performs both tasks correctly  2.  Best Gaze: 0=normal  3.  Visual: 0=No visual loss  4. Facial Palsy: 2=Partial paralysis (total or near total paralysis of the lower face)  5a.  Motor left arm: 1=Drift, limb holds 90 (or 45) degrees but drifts down before full  10 seconds: does not hit bed  5b.  Motor right arm: 0=No drift, limb holds 90 (or 45) degrees for full 10 seconds  6a. motor left leg: 0=No drift, limb holds 90 (or 45) degrees for full 10 seconds  6b  Motor right leg:  0=No drift, limb holds 90 (or 45) degrees for full 10 seconds  7. Limb Ataxia: 0=Absent  8.  Sensory: 0=Normal; no sensory loss  9. Best Language:  0=No aphasia, normal  10. Dysarthria: 1=Mild to moderate, patient slurs at least some words and at worst, can be understood with some difficulty  11. Extinction and Inattention: 0=No abnormality  12. Distal motor function: 0=Normal   Total:   4   I discussed the patient with neurology after they have performed their evaluation.  He believes his symptoms are consistent with an acute CVA however the patient states she did not feel well when she woke up this morning.  The fact that the CT is showing an acute CVA this could possibly be an older CVA that developed overnight.  Given the patient's age,  comorbidities and questionable onset the neurologist is electing not to push TPA.  I agree with this decision.  Patient's labs are largely at baseline.  We will dose aspirin and admit to the hospital for CVA.  CRITICAL CARE Performed by: Minna Antis   Total critical care time: 30 minutes  Critical care time was exclusive of separately billable procedures and treating other patients.  Critical care was necessary to treat or prevent imminent or life-threatening deterioration.  Critical care was time spent personally by me on the following activities: development of treatment plan with patient and/or surrogate as well as nursing, discussions with consultants, evaluation of patient's response to treatment, examination of patient, obtaining history from patient or surrogate, ordering and performing treatments and interventions, ordering and review of laboratory studies, ordering and review of radiographic studies, pulse oximetry and re-evaluation of patient's condition.  ____________________________________________   FINAL CLINICAL IMPRESSION(S) / ED DIAGNOSES  Acute CVA    Minna Antis, MD 06/19/17 1204

## 2017-06-20 ENCOUNTER — Encounter: Payer: Self-pay | Admitting: Emergency Medicine

## 2017-06-20 ENCOUNTER — Inpatient Hospital Stay
Admission: EM | Admit: 2017-06-20 | Discharge: 2017-06-24 | DRG: 689 | Disposition: A | Discharge status: Skilled Nursing Facility | Payer: Medicare Other | Attending: Internal Medicine | Admitting: Internal Medicine

## 2017-06-20 DIAGNOSIS — I10 Essential (primary) hypertension: Secondary | ICD-10-CM | POA: Diagnosis present

## 2017-06-20 DIAGNOSIS — F0390 Unspecified dementia without behavioral disturbance: Secondary | ICD-10-CM | POA: Diagnosis present

## 2017-06-20 DIAGNOSIS — M1710 Unilateral primary osteoarthritis, unspecified knee: Secondary | ICD-10-CM | POA: Diagnosis present

## 2017-06-20 DIAGNOSIS — I639 Cerebral infarction, unspecified: Secondary | ICD-10-CM | POA: Diagnosis not present

## 2017-06-20 DIAGNOSIS — Z96659 Presence of unspecified artificial knee joint: Secondary | ICD-10-CM | POA: Diagnosis present

## 2017-06-20 DIAGNOSIS — N390 Urinary tract infection, site not specified: Principal | ICD-10-CM | POA: Diagnosis present

## 2017-06-20 DIAGNOSIS — K219 Gastro-esophageal reflux disease without esophagitis: Secondary | ICD-10-CM | POA: Diagnosis present

## 2017-06-20 DIAGNOSIS — R52 Pain, unspecified: Secondary | ICD-10-CM

## 2017-06-20 DIAGNOSIS — E785 Hyperlipidemia, unspecified: Secondary | ICD-10-CM | POA: Diagnosis present

## 2017-06-20 DIAGNOSIS — Z8673 Personal history of transient ischemic attack (TIA), and cerebral infarction without residual deficits: Secondary | ICD-10-CM

## 2017-06-20 DIAGNOSIS — R4182 Altered mental status, unspecified: Secondary | ICD-10-CM

## 2017-06-20 DIAGNOSIS — F329 Major depressive disorder, single episode, unspecified: Secondary | ICD-10-CM | POA: Diagnosis present

## 2017-06-20 DIAGNOSIS — E039 Hypothyroidism, unspecified: Secondary | ICD-10-CM | POA: Diagnosis present

## 2017-06-20 DIAGNOSIS — G9341 Metabolic encephalopathy: Secondary | ICD-10-CM | POA: Diagnosis present

## 2017-06-20 DIAGNOSIS — Z7902 Long term (current) use of antithrombotics/antiplatelets: Secondary | ICD-10-CM

## 2017-06-20 DIAGNOSIS — Z66 Do not resuscitate: Secondary | ICD-10-CM | POA: Diagnosis present

## 2017-06-20 DIAGNOSIS — L409 Psoriasis, unspecified: Secondary | ICD-10-CM | POA: Diagnosis present

## 2017-06-20 DIAGNOSIS — R29810 Facial weakness: Secondary | ICD-10-CM | POA: Diagnosis present

## 2017-06-20 DIAGNOSIS — R410 Disorientation, unspecified: Secondary | ICD-10-CM

## 2017-06-20 LAB — COMPREHENSIVE METABOLIC PANEL
ALK PHOS: 53 U/L (ref 38–126)
ALT: 12 U/L — AB (ref 14–54)
AST: 16 U/L (ref 15–41)
Albumin: 3.3 g/dL — ABNORMAL LOW (ref 3.5–5.0)
Anion gap: 7 (ref 5–15)
BUN: 13 mg/dL (ref 6–20)
CO2: 26 mmol/L (ref 22–32)
CREATININE: 0.61 mg/dL (ref 0.44–1.00)
Calcium: 8 mg/dL — ABNORMAL LOW (ref 8.9–10.3)
Chloride: 103 mmol/L (ref 101–111)
GFR calc Af Amer: >60 mL/min (ref >60)
GFR calc non Af Amer: >60 mL/min (ref >60)
GLUCOSE: 112 mg/dL — AB (ref 65–99)
Potassium: 3.6 mmol/L (ref 3.5–5.1)
SODIUM: 136 mmol/L (ref 135–145)
Total Bilirubin: 0.8 mg/dL (ref 0.3–1.2)
Total Protein: 6.3 g/dL — ABNORMAL LOW (ref 6.5–8.1)

## 2017-06-20 LAB — CBC
HCT: 40.4 % (ref 35.0–47.0)
Hemoglobin: 13.9 g/dL (ref 12.0–16.0)
MCH: 30 pg (ref 26.0–34.0)
MCHC: 34.4 g/dL (ref 32.0–36.0)
MCV: 87.1 fL (ref 80.0–100.0)
PLATELETS: 287 10*3/uL (ref 150–440)
RBC: 4.63 MIL/uL (ref 3.80–5.20)
RDW: 13.2 % (ref 11.5–14.5)
WBC: 8.8 10*3/uL (ref 3.6–11.0)

## 2017-06-20 MED ORDER — CLOPIDOGREL BISULFATE 75 MG PO TABS: 75.0000 mg | ORAL_TABLET | Freq: Every day | ORAL | 0 refills | Status: AC

## 2017-06-20 MED ORDER — LEVOTHYROXINE SODIUM 50 MCG PO TABS: 75.0000 ug | ORAL_TABLET | Freq: Every day | ORAL | Status: DC

## 2017-06-20 MED ORDER — ATORVASTATIN CALCIUM 10 MG PO TABS: 10.0000 mg | ORAL_TABLET | Freq: Every day | ORAL | 0 refills | Status: AC

## 2017-06-20 MED ORDER — GUAIFENESIN-DM 100-10 MG/5ML PO SYRP
5.0000 mL | ORAL_SOLUTION | ORAL | Status: DC | PRN
  Filled 2017-06-20: qty 5

## 2017-06-20 NOTE — Care Management Note (Signed)
Case Management Note  Patient Details  Name: Christella HartiganDorothy R Scadden MRN: [id number] Date of Birth: 03/01/1921  Subjective/Objective:     Referral for HH=PT, RN called to Elnita Maxwellheryl at [facility] per Mrs Conard NovakHinshaw [facility]                Action/Plan:   Expected Discharge Date:  06/20/17               Expected Discharge Plan:     In-House Referral:     Discharge planning Services     Post Acute Care Choice:    Choice offered to:     DME Arranged:    DME Agency:     HH Arranged:    HH Agency:     Status of Service:     If discussed at [employer] Meetings, dates discussed:    Additional Comments:  Anallely Rosell A, RN 06/20/2017, 11:14 AM

## 2017-06-20 NOTE — Discharge Summary (Signed)
[facility] [place] at [facility]   PATIENT NAME: Alyssa King    MR#:  [id number]  DATE OF BIRTH:  [birth year]  DATE OF ADMISSION:  06/19/2017 ADMITTING PHYSICIAN: Marguarite Arbour, MD  DATE OF DISCHARGE: 06/20/2017  PRIMARY CARE PHYSICIAN: [facility]    ADMISSION DIAGNOSIS:  CVA (cerebral vascular accident) [facility]) [I63.9] Cerebrovascular accident (CVA), unspecified mechanism (HCC) [I63.9]  DISCHARGE DIAGNOSIS:  Principal Problem:   CVA (cerebral vascular accident) (HCC) Active Problems:   HTN, goal below 140/80   Gait disturbance   HLD (hyperlipidemia)   SECONDARY DIAGNOSIS:   Past Medical History:  Diagnosis Date  . Arthritis   . Cataract   . Depression   . GERD (gastroesophageal reflux disease)   . Hyperlipidemia   . Hypertension   . Psoriasis   . Stroke (HCC)   . Thyroid disease     HOSPITAL COURSE:   83 year old female with a history of essential hypertension and hyperlipidemia who presented with left extremity weakness and facial droop.  1. Acute basal ganglia infarct: CT confirmed CVA. Her symptoms actually have resolved. Aspirin was changed Plavix and she will start statin. She will have home health physical therapy upon discharge. Carotid ultrasound showed no hemodynamically significant stenosis.  2. Essential hypertension: Patient will continue Norvasc, Lotensin and Maxzide  3. Hypothyroid: Continue Synthroid  4. Hyperlipidemia: Patient will start statin and continue omega-3 fatty acids   DISCHARGE CONDITIONS AND DIET:   Stable for discharge on a heart healthy diet  CONSULTS OBTAINED:  Treatment Team:  Pauletta Browns, MD  DRUG ALLERGIES:  No Known Allergies  DISCHARGE MEDICATIONS:   Current Discharge Medication List    START taking these medications   Details  atorvastatin (LIPITOR) 10 MG tablet Take 1 tablet (10 mg total) by mouth daily at 6 PM. Qty: 30 tablet, Refills: 0    clopidogrel  (PLAVIX) 75 MG tablet Take 1 tablet (75 mg total) by mouth daily. Qty: 30 tablet, Refills: 0      CONTINUE these medications which have NOT CHANGED   Details  acetaminophen (TYLENOL) 325 MG tablet Take 650 mg by mouth 2 (two) times daily. Pt is also able to take twice daily as needed for pain in between scheduled doses.    amLODipine (NORVASC) 5 MG tablet Take 5 mg by mouth daily.    benazepril (LOTENSIN) 10 MG tablet Take 10 mg by mouth daily.    cholecalciferol (VITAMIN D) 1000 units tablet Take 2,000 Units by mouth daily.    diclofenac sodium (VOLTAREN) 1 % GEL Apply 2 g topically 2 (two) times daily. Pt applies to both legs.    escitalopram (LEXAPRO) 10 MG tablet Take 10 mg by mouth daily.    levothyroxine (SYNTHROID, LEVOTHROID) 75 MCG tablet Take 75 mcg by mouth every evening.     Omega-3 Fatty Acids (FISH OIL) 1000 MG CAPS Take 1,000 mg by mouth daily.    ranitidine (ZANTAC) 150 MG tablet Take 150 mg by mouth at bedtime.    triamterene-hydrochlorothiazide (MAXZIDE-25) 37.5-25 MG tablet Take 0.5 tablets by mouth daily.    vitamin B-12 (CYANOCOBALAMIN) 1000 MCG tablet Take 1,000 mcg by mouth every Monday, Wednesday, and Friday.      STOP taking these medications     Lidocaine (ASPERCREME LIDOCAINE) 4 % PTCH           Today   CHIEF COMPLAINT:   Symptoms have resolved.   VITAL SIGNS:  Blood pressure (!) 157/73, pulse 76,  temperature 98 F (36.7 C), temperature source Oral, resp. rate 20, height 5\' 3"  (1.6 m), weight 83.4 kg (183 lb 12.8 oz), SpO2 96 %.   REVIEW OF SYSTEMS:  Review of Systems  Constitutional: Negative.  Negative for chills, fever and malaise/fatigue.  HENT: Negative.  Negative for ear discharge, ear pain, hearing loss, nosebleeds and sore throat.   Eyes: Negative.  Negative for blurred vision and pain.  Respiratory: Negative.  Negative for cough, hemoptysis, shortness of breath and wheezing.   Cardiovascular: Negative.  Negative for chest  pain, palpitations and leg swelling.  Gastrointestinal: Negative.  Negative for abdominal pain, blood in stool, diarrhea, nausea and vomiting.  Genitourinary: Negative.  Negative for dysuria.  Musculoskeletal: Negative.  Negative for back pain.  Skin: Negative.   Neurological: Negative for dizziness, tremors, speech change, focal weakness, seizures and headaches.  Endo/Heme/Allergies: Negative.  Does not bruise/bleed easily.  Psychiatric/Behavioral: Negative.  Negative for depression, hallucinations and suicidal ideas.     PHYSICAL EXAMINATION:  GENERAL:  83 y.o.-year-old patient lying in the bed with no acute distress.  NECK:  Supple, no jugular venous distention. No thyroid enlargement, no tenderness.  LUNGS: Normal breath sounds bilaterally, no wheezing, rales,rhonchi  No use of accessory muscles of respiration.  CARDIOVASCULAR: S1, S2 normal. No murmurs, rubs, or gallops.  ABDOMEN: Soft, non-tender, non-distended. Bowel sounds present. No organomegaly or mass.  EXTREMITIES: No pedal edema, cyanosis, or clubbing.  PSYCHIATRIC: The patient is alert and oriented x 3.  SKIN: No obvious rash, lesion, or ulcer.   DATA REVIEW:   CBC Recent Labs  Lab 06/20/17 0336  WBC 8.8  HGB 13.9  HCT 40.4  PLT 287    Chemistries  Recent Labs  Lab 06/20/17 0336  NA 136  K 3.6  CL 103  CO2 26  GLUCOSE 112*  BUN 13  CREATININE 0.61  CALCIUM 8.0*  AST 16  ALT 12*  ALKPHOS 53  BILITOT 0.8    Cardiac Enzymes Recent Labs  Lab 06/19/17 1111  TROPONINI <0.03    Microbiology Results  @[email or web address] @  RADIOLOGY:  [place] Carotid Bilateral  Result Date: 06/20/2017 CLINICAL DATA:  83 year old female with a history of TIA. Cardiovascular risk factors include hypertension, hyperlipidemia EXAM: BILATERAL CAROTID DUPLEX ULTRASOUND TECHNIQUE: Wallace CullensGray scale imaging, color Doppler and duplex ultrasound were performed of bilateral carotid and vertebral arteries in the neck. COMPARISON:  04/01/2011  FINDINGS: Criteria: Quantification of carotid stenosis is based on velocity parameters that correlate the residual internal carotid diameter with NASCET-based stenosis levels, using the diameter of the distal internal carotid lumen as the denominator for stenosis measurement. The following velocity measurements were obtained: RIGHT ICA:  Systolic 53 cm/sec, Diastolic 13 cm/sec CCA:  57 cm/sec SYSTOLIC ICA/CCA RATIO:  0.9 ECA:  110 cm/sec LEFT ICA:  Systolic 75 cm/sec, Diastolic 21 cm/sec CCA:  76 cm/sec SYSTOLIC ICA/CCA RATIO:  1.0 ECA:  140 cm/sec Right Brachial SBP: Not acquired Left Brachial SBP: Not acquired RIGHT CAROTID ARTERY: No significant calcified disease of the right common carotid artery. Intermediate waveform maintained. Heterogeneous plaque without significant calcifications at the right carotid bifurcation. Low resistance waveform of the right ICA. Mild tortuosity. RIGHT VERTEBRAL ARTERY: Antegrade flow with low resistance waveform. LEFT CAROTID ARTERY: No significant calcified disease of the left common carotid artery. Intermediate waveform maintained. Heterogeneous plaque at the left carotid bifurcation without significant calcifications. Low resistance waveform of the left ICA. LEFT VERTEBRAL ARTERY:  Antegrade flow with low resistance waveform. IMPRESSION: Color duplex indicates minimal  heterogeneous plaque, with no hemodynamically significant stenosis by duplex criteria in the extracranial cerebrovascular circulation. Signed, Yvone Neu. Loreta Ave, DO [facility] Electronically Signed   By: Gilmer Mor D.O.   On: 06/20/2017 08:12   Ct Head Code Stroke Wo Contrast  Result Date: 06/19/2017 CLINICAL DATA:  Code stroke.  Left-sided weakness.  Facial droop. EXAM: CT HEAD WITHOUT CONTRAST TECHNIQUE: Contiguous axial images were obtained from the base of the skull through the vertex without intravenous contrast. COMPARISON:  Brain MRI  04/01/2011 FINDINGS: Brain: Small chronic infarcts are again seen in the cerebellum and right corona radiata. Periventricular white matter hypodensities bilaterally are nonspecific but compatible with mild chronic small vessel ischemic disease. Cerebral atrophy is not greater than expected for age. There is patchy low density anteriorly in the right lentiform nucleus, which was normal on the prior MRI and on an older CT from 06/07/2010. There may also be involvement of the anterior limb of the right internal capsule. There is no evidence of acute cortically based infarct, intracranial hemorrhage, mass, midline shift, or extra-axial fluid collection. Vascular: Calcified atherosclerosis at the skullbase. No hyperdense vessel. Skull: No fracture or focal osseous lesion. Sinuses/Orbits: Visualized paranasal sinuses and mastoid air cells are clear. Bilateral cataract extraction. Other: None. ASPECTS (Alberta Stroke Program Early CT Score) - Ganglionic level infarction (caudate, lentiform nuclei, internal capsule, insula, M1-M3 cortex): 5-6 (lentiform nucleus and possible internal capsule involvement) - Supraganglionic infarction (M4-M6 cortex): 3 Total score (0-10 with 10 being normal): 8-9 IMPRESSION: 1. Acute right basal ganglia infarct.  No hemorrhage. 2. ASPECTS is 8-9. 3. Mild chronic small vessel ischemic disease. These results were called by telephone at the time of interpretation on 06/19/2017 at 11:43 am to Dr. Minna Antis , who verbally acknowledged these results. Electronically Signed   By: Sebastian Ache M.D.   On: 06/19/2017 11:46      Current Discharge Medication List    START taking these medications   Details  atorvastatin (LIPITOR) 10 MG tablet Take 1 tablet (10 mg total) by mouth daily at 6 PM. Qty: 30 tablet, Refills: 0    clopidogrel (PLAVIX) 75 MG tablet Take 1 tablet (75 mg total) by mouth daily. Qty: 30 tablet, Refills: 0      CONTINUE these medications which have NOT CHANGED    Details  acetaminophen (TYLENOL) 325 MG tablet Take 650 mg by mouth 2 (two) times daily. Pt is also able to take twice daily as needed for pain in between scheduled doses.    amLODipine (NORVASC) 5 MG tablet Take 5 mg by mouth daily.    benazepril (LOTENSIN) 10 MG tablet Take 10 mg by mouth daily.    cholecalciferol (VITAMIN D) 1000 units tablet Take 2,000 Units by mouth daily.    diclofenac sodium (VOLTAREN) 1 % GEL Apply 2 g topically 2 (two) times daily. Pt applies to both legs.    escitalopram (LEXAPRO) 10 MG tablet Take 10 mg by mouth daily.    levothyroxine (SYNTHROID, LEVOTHROID) 75 MCG tablet Take 75 mcg by mouth every evening.     Omega-3 Fatty Acids (FISH OIL) 1000 MG CAPS Take 1,000 mg by mouth daily.    ranitidine (ZANTAC) 150 MG tablet Take 150 mg by mouth at bedtime.    triamterene-hydrochlorothiazide (MAXZIDE-25) 37.5-25 MG tablet Take 0.5 tablets by mouth daily.    vitamin B-12 (CYANOCOBALAMIN) 1000 MCG tablet Take 1,000 mcg by mouth every Monday, Wednesday, and Friday.  STOP taking these medications     Lidocaine (ASPERCREME LIDOCAINE) 4 % PTCH             Management plans discussed with the patient and she is in agreement. Stable for discharge   Patient should follow up with pcp  CODE STATUS:     Code Status Orders  (From admission, onward)        Start     Ordered   06/19/17 1607  Full code  Continuous     06/19/17 1606    Code Status History    Date Active Date Inactive Code Status Order ID Comments User Context   This patient has a current code status but no historical code status.    Advance Directive Documentation     Most Recent Value  Type of Advance Directive  Healthcare Power of Attorney  Pre-existing out of facility DNR order (yellow form or pink MOST form)  No data  "MOST" Form in Place?  No data      TOTAL TIME TAKING CARE OF THIS PATIENT: 3 minutes.    Note: This dictation was prepared with Dragon dictation along  with smaller phrase technology. Any transcriptional errors that result from this process are unintentional.  Arieona Swaggerty M.D on 06/20/2017 at 10:06 AM  Between 7am to 6pm - Pager - [phone] After 6pm go to [email or web address] - Social research officer, governmentpassword [email or web address] [email or web address]  [facility]  Office  [phone]  CC: Primary care physician; [facility]

## 2017-06-20 NOTE — Progress Notes (Signed)
Late submission due to time constraints: LCSW collected data to complete assessment for this patient and engaged with family. Its is the families plan [facility].  Delta Air LinesClaudine Sheppard Luckenbach LCSW [phone]

## 2017-06-20 NOTE — ED Triage Notes (Signed)
[facility] Facility called out for unresponsive after entering pt's room and finding her in recliner "unreposnsive."EMS arrived and pt was alert and oriented x4 & in no acute distress. Upon arrival pt was alert and oriented with no complaints.

## 2017-06-20 NOTE — Clinical Social Work Note (Signed)
[facility]. The patient's daughter and facility are in agreement, and the packet has been delivered to the chart. CSW will continue to follow pending additional discharge needs.  Argentina PonderKaren Martha Jaylan Hinojosa, MSW, Theresia MajorsLCSWA [phone]

## 2017-06-20 NOTE — Clinical Social Work Note (Signed)
Clinical Social Work Assessment  Patient Details  Name: Alyssa HartiganDorothy R Lezotte MRN: [id number] Date of Birth: 11/15/1920  Date of referral:  06/19/17               Reason for consult:  Other (Comment Required)([facility])                Permission sought to share information with:  Family Supports Permission granted to share information::  Yes, Verbal Permission Granted  Name::     Laurice Recordmy McKay [phone] Domenic SchwabSkip Karges ( son) [phone]  Agency::     Relationship::     Contact Information:     Housing/Transportation Living arrangements for the past 2 months:  Assisted Living Facility Source of Information:  Patient, Adult Children Patient Interpreter Needed:  None Criminal Activity/Legal Involvement Pertinent to Current Situation/Hospitalization:  No - Comment as needed Significant Relationships:  Adult Children, Church Lives with:  Facility Resident Do you feel safe going back to the place where you live?  Yes Need for family participation in patient care:  Yes (Comment)  Care giving concerns: Family concerned due to altered state and facial droop and slight slurred speech   Social Worker assessment / plan: Patient is a 83 year old female and presented to ED with weakness and slurred speech,symptoms appeared to improve yesterday.LCSW introduced myself to family and patient and obtained verbal consent to collect information to complete assessment. The family wishes their [facility] once medically cleared. Patient is oriented x4 and independent with most ADL's and does walk with walker and she is continent. She has excellent family support and involvement. [facility]. Fl2 started.  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[employer]) Insurance information:  Medicare, Other (Comment Required)(AARP) PT Recommendations:  Not assessed at this time Information / Referral to  community resources:  Other (Comment Required)(Provided and engaged emotional support)  Patient/Family's Response to care:  Good understanding  Patient/Family's Understanding of and Emotional Response to Diagnosis, Current Treatment, and Prognosis:  Good understanding she will remain and have a medical work up  Emotional Assessment Appearance:  Appears stated age Attitude/Demeanor/Rapport:    Affect (typically observed):  Accepting, Adaptable, Calm Orientation:  Oriented to Self, Oriented to Place, Oriented to  Time Alcohol / Substance use:  Not Applicable Psych involvement (Current and /or in the community):  No (Comment)(Patient does have some depression and is treated at facility)  Discharge Needs  Concerns to be addressed:  No discharge needs identified Readmission within the last 30 days:  No Current discharge risk:  None Barriers to Discharge:  No Barriers Identified, Continued Medical Work up   Cheron SchaumannBandi, Sharece Fleischhacker M, LCSW 06/20/2017, 7:47 AM

## 2017-06-20 NOTE — Evaluation (Signed)
Physical Therapy Evaluation Patient Details Name: Alyssa HartiganDorothy R King MRN: [id number] DOB: 05/16/1921 Today's Date: 06/20/2017   History of Present Illness   Patient is a 83 year old female and presented to ED with weakness and slurred speech,symptoms appeared to improve yesterday. [facility] and will be returning there where she recieves PT at least 3x/week. Pt. ambulates with walker and is oriented x4.   Clinical Impression  Patient presents with LLE weakness of 4/5 strength secondary to CVA. Nurse present during portion of evaluation. Pt. Independent in bed mobility and Mod I for transfers at this time. Ambulation with RW presents with slight decreased weight acceptance onto LLE however overall quality of gait comparative to baseline according to family member and patient.  Pt. Left in chair with daughter and nurse present with no questions or concerns. Patient will benefit from PT when [facility] to continue improving mobility for increased QOL.     Follow Up Recommendations SNF;Home health PT([facility])    Equipment Recommendations       Recommendations for Other Services       Precautions / Restrictions Precautions Precautions: None;Fall Precaution Comments: ambulate with walker  Restrictions Weight Bearing Restrictions: No      Mobility  Bed Mobility Overal bed mobility: Independent             General bed mobility comments: independent rolling L and R and up to sitting position  Transfers Overall transfer level: Modified independent Equipment used: Standard walker             General transfer comment: CGA for STS w walker,   Ambulation/Gait Ambulation/Gait assistance: Min guard Ambulation Distance (Feet): 65 Feet Assistive device: Standard walker Gait Pattern/deviations: Step-through pattern;Decreased stance time - left;Decreased weight shift to left   Gait velocity interpretation: at or above normal speed for  age/gender General Gait Details: Slight gait deviation when ambulating with walker due to LLE weakness, did not limit ambulation.   Stairs            Wheelchair Mobility    Modified Rankin (Stroke Patients Only)       Balance Overall balance assessment: Needs assistance Sitting-balance support: Feet supported Sitting balance-Leahy Scale: Good     Standing balance support: Bilateral upper extremity supported;During functional activity Standing balance-Leahy Scale: Fair Standing balance comment: Require UE assistance to retain balance with movement.                              Pertinent Vitals/Pain Pain Assessment: No/denies pain    Home Living Family/patient expects to be discharged to:: Assisted living               Home Equipment: Dan HumphreysWalker - 4 wheels Additional Comments[facility]     Prior Function Level of Independence: Independent with assistive device(s)         Comments: resident [facility]     Hand Dominance        Extremity/Trunk Assessment   Upper Extremity Assessment Upper Extremity Assessment: Overall WFL for tasks assessed    Lower Extremity Assessment Lower Extremity Assessment: Overall WFL for tasks assessed;LLE deficits/detail LLE Deficits / Details: 4/5 strength of LLE.        Communication   Communication: No difficulties  Cognition Arousal/Alertness: Awake/alert Behavior During Therapy: WFL for tasks assessed/performed Overall Cognitive Status: Within Functional Limits for  tasks assessed                                        General Comments General comments (skin integrity, edema, etc.): Able to stand with single UE support, stand without UE support for 8 seconds    Exercises Other Exercises Other Exercises: sit to stand from bed and chair with UE assistance and CGA Other Exercises: Ambulate 60 ft with CGA and RW Other Exercises: static standing balance    Assessment/Plan    PT Assessment Patient needs continued PT services  PT Problem List Decreased strength;Decreased activity tolerance       PT Treatment Interventions Gait training;Functional mobility training;Neuromuscular re-education;Balance training;Therapeutic exercise;Therapeutic activities;Patient/family education;Manual techniques    PT Goals (Current goals can be found in the Care Plan section)  Acute Rehab PT Goals Patient Stated Goal: [facility]  PT Goal Formulation: With patient/family Time For Goal Achievement: 07/04/17 Potential to Achieve Goals: Good    Frequency Min 2X/week   Barriers to discharge        Co-evaluation               AM-PAC PT "6 Clicks" Daily Activity  Outcome Measure Difficulty turning over in bed (including adjusting bedclothes, sheets and blankets)?: None Difficulty moving from lying on back to sitting on the side of the bed? : A Little Difficulty sitting down on and standing up from a chair with arms (e.g., wheelchair, bedside commode, etc,.)?: A Little Help needed moving to and from a bed to chair (including a wheelchair)?: A Little Help needed walking in hospital room?: A Little Help needed climbing 3-5 steps with a railing? : A Lot 6 Click Score: 18    End of Session Equipment Utilized During Treatment: Gait belt Activity Tolerance: Patient tolerated treatment well Patient left: in chair;with nursing/sitter in room;with family/visitor present;with call bell/phone within reach Nurse Communication: Mobility status PT Visit Diagnosis: Unsteadiness on feet (R26.81);Other abnormalities of gait and mobility (R26.89);Muscle weakness (generalized) (M62.81)    Time: [id number] PT Time Calculation (min) (ACUTE ONLY): 29 min   Charges:   PT Evaluation $PT Eval Low Complexity: 1 Low PT Treatments $Therapeutic Exercise: 8-22 mins   PT G Codes:   PT G-Codes **NOT FOR INPATIENT CLASS** Functional Assessment Tool Used:  AM-PAC 6 Clicks Basic Mobility;Clinical judgement Functional Limitation: Mobility: Walking and moving around Mobility: Walking and Moving Around Current Status (F6213(G8978): At least 20 percent but less than 40 percent impaired, limited or restricted Mobility: Walking and Moving Around Goal Status [phone](G8979): At least 1 percent but less than 20 percent impaired, limited or restricted  Precious BardMarina Angeleah Labrake, PT, DPT    Precious BardMarina Amaka Gluth 06/20/2017, 11:15 AM

## 2017-06-20 NOTE — NC FL2 (Addendum)
[place] MEDICAID FL2 LEVEL OF CARE SCREENING TOOL     IDENTIFICATION  Patient Name: Alyssa King Birthdate: 09/01/1920 Sex: female Admission Date (Current Location): 06/19/2017  [place]   Facility and Address:  [facility], [address], [place], [place] [address]      Provider Number: [id number]  Attending Physician Name and Address:  Adrian SaranMody, Sital, MD  Relative Name and Phone Number:       Current Level of Care: Hospital Recommended Level of Care: Assisted Living Facility Prior Approval Number:    Date Approved/Denied:   PASRR Number:    Discharge Plan: Domiciliary (Rest home)    Current Diagnoses: Patient Active Problem List   Diagnosis Date Noted  . CVA (cerebral vascular accident) (HCC) 06/19/2017  . HTN, goal below 140/80 06/19/2017  . Gait disturbance 06/19/2017  . HLD (hyperlipidemia) 06/19/2017    Orientation RESPIRATION BLADDER Height & Weight     Self, Time, Situation, Place  Normal Continent Weight: 183 lb 12.8 oz (83.4 kg) Height:  5\' 3"  (160 cm)  BEHAVIORAL SYMPTOMS/MOOD NEUROLOGICAL BOWEL NUTRITION STATUS      Continent Diet(Normal)  AMBULATORY STATUS COMMUNICATION OF NEEDS Skin   Supervision Verbally Normal                       Personal Care Assistance Level of Assistance  Bathing, Feeding, Dressing, Total care Bathing Assistance: Independent Feeding assistance: Independent Dressing Assistance: Independent Total Care Assistance: Independent   Functional Limitations Info  Sight, Hearing, Speech Sight Info: Adequate Hearing Info: Adequate Speech Info: Adequate    SPECIAL CARE FACTORS FREQUENCY                       Contractures Contractures Info: Not present    Additional Factors Info  Allergies   Allergies Info: None noted           Current Medications (06/20/2017):  This is the current hospital active medication list Current Facility-Administered  Medications  Medication Dose Route Frequency Provider Last Rate Last Dose  . 0.9 % NaCl with KCl 40 mEq / L  infusion   Intravenous Continuous Marguarite ArbourSparks, Jeffrey D, MD 75 mL/hr at 06/20/17 0444 75 mL/hr at 06/20/17 0444  . acetaminophen (TYLENOL) tablet 650 mg  650 mg Oral Q6H PRN Marguarite ArbourSparks, Jeffrey D, MD   650 mg at 06/20/17 0446   Or  . acetaminophen (TYLENOL) suppository 650 mg  650 mg Rectal Q6H PRN Marguarite ArbourSparks, Jeffrey D, MD      . amLODipine (NORVASC) tablet 5 mg  5 mg Oral Daily Marguarite ArbourSparks, Jeffrey D, MD      . atorvastatin (LIPITOR) tablet 10 mg  10 mg Oral q1800 Marguarite ArbourSparks, Jeffrey D, MD   10 mg at 06/19/17 1824  . benazepril (LOTENSIN) tablet 10 mg  10 mg Oral Daily Marguarite ArbourSparks, Jeffrey D, MD      . bisacodyl (DULCOLAX) suppository 10 mg  10 mg Rectal Daily PRN Marguarite ArbourSparks, Jeffrey D, MD      . cholecalciferol (VITAMIN D) tablet 2,000 Units  2,000 Units Oral Daily Marguarite ArbourSparks, Jeffrey D, MD      . clopidogrel (PLAVIX) tablet 75 mg  75 mg Oral Daily Marguarite ArbourSparks, Jeffrey D, MD      . diclofenac sodium (VOLTAREN) 1 % transdermal gel 2 g  2 g Topical BID Marguarite ArbourSparks, Jeffrey D, MD      . docusate sodium (COLACE) capsule 100 mg  100  mg Oral BID Marguarite ArbourSparks, Jeffrey D, MD      . escitalopram (LEXAPRO) tablet 10 mg  10 mg Oral Daily Marguarite ArbourSparks, Jeffrey D, MD      . famotidine (PEPCID) tablet 20 mg  20 mg Oral QHS Marguarite ArbourSparks, Jeffrey D, MD   20 mg at 06/19/17 2107  . guaiFENesin-dextromethorphan (ROBITUSSIN DM) 100-10 MG/5ML syrup 5 mL  5 mL Oral Q4H PRN Mody, Sital, MD      . heparin injection 5,000 Units  5,000 Units Subcutaneous Q8H Marguarite ArbourSparks, Jeffrey D, MD   5,000 Units at 06/20/17 0446  . ondansetron (ZOFRAN) tablet 4 mg  4 mg Oral Q6H PRN Marguarite ArbourSparks, Jeffrey D, MD       Or  . ondansetron (ZOFRAN) injection 4 mg  4 mg Intravenous Q6H PRN Marguarite ArbourSparks, Jeffrey D, MD      . pantoprazole (PROTONIX) EC tablet 40 mg  40 mg Oral Daily Marguarite ArbourSparks, Jeffrey D, MD      . triamterene-hydrochlorothiazide (MAXZIDE-25) 37.5-25 MG per tablet 0.5 tablet  0.5 tablet Oral Daily  Marguarite ArbourSparks, Jeffrey D, MD         Discharge Medications: START taking these medications   Details  atorvastatin (LIPITOR) 10 MG tablet Take 1 tablet (10 mg total) by mouth daily at 6 PM. Qty: 30 tablet, Refills: 0    clopidogrel (PLAVIX) 75 MG tablet Take 1 tablet (75 mg total) by mouth daily. Qty: 30 tablet, Refills: 0          CONTINUE these medications which have NOT CHANGED   Details  acetaminophen (TYLENOL) 325 MG tablet Take 650 mg by mouth 2 (two) times daily. Pt is also able to take twice daily as needed for pain in between scheduled doses.    amLODipine (NORVASC) 5 MG tablet Take 5 mg by mouth daily.    benazepril (LOTENSIN) 10 MG tablet Take 10 mg by mouth daily.    cholecalciferol (VITAMIN D) 1000 units tablet Take 2,000 Units by mouth daily.    diclofenac sodium (VOLTAREN) 1 % GEL Apply 2 g topically 2 (two) times daily. Pt applies to both legs.    escitalopram (LEXAPRO) 10 MG tablet Take 10 mg by mouth daily.    levothyroxine (SYNTHROID, LEVOTHROID) 75 MCG tablet Take 75 mcg by mouth every evening.     Omega-3 Fatty Acids (FISH OIL) 1000 MG CAPS Take 1,000 mg by mouth daily.    ranitidine (ZANTAC) 150 MG tablet Take 150 mg by mouth at bedtime.    triamterene-hydrochlorothiazide (MAXZIDE-25) 37.5-25 MG tablet Take 0.5 tablets by mouth daily.    vitamin B-12 (CYANOCOBALAMIN) 1000 MCG tablet Take 1,000 mcg by mouth every Monday, Wednesday, and Friday.         STOP taking these medications     Lidocaine (ASPERCREME LIDOCAINE) 4 % PTCH        Relevant Imaging Results:  Relevant Lab Results:   Additional Information SSN [id number]  Cheron SchaumannBandi, Claudine M, [place]

## 2017-06-20 NOTE — Plan of Care (Signed)
  Adequate for Discharge Education: Knowledge of General Education information will improve 06/20/2017 1216 - Adequate for Discharge by Leen Tworek, Toma Aranonna K, RN Health Behavior/Discharge Planning: Ability to manage health-related needs will improve 06/20/2017 1216 - Adequate for Discharge by Marrian SalvageHeslep, [place] Paiz K, RN Nutrition: Adequate nutrition will be maintained 06/20/2017 1216 - Adequate for Discharge by Enrique Manganaro, Toma Aranonna K, RN

## 2017-06-21 ENCOUNTER — Encounter: Payer: Self-pay | Admitting: Internal Medicine

## 2017-06-21 ENCOUNTER — Emergency Department: Payer: Medicare Other

## 2017-06-21 ENCOUNTER — Other Ambulatory Visit: Payer: Self-pay

## 2017-06-21 DIAGNOSIS — M1710 Unilateral primary osteoarthritis, unspecified knee: Secondary | ICD-10-CM | POA: Diagnosis present

## 2017-06-21 DIAGNOSIS — K219 Gastro-esophageal reflux disease without esophagitis: Secondary | ICD-10-CM | POA: Diagnosis present

## 2017-06-21 DIAGNOSIS — Z7902 Long term (current) use of antithrombotics/antiplatelets: Secondary | ICD-10-CM | POA: Diagnosis not present

## 2017-06-21 DIAGNOSIS — G9341 Metabolic encephalopathy: Secondary | ICD-10-CM | POA: Diagnosis present

## 2017-06-21 DIAGNOSIS — F0390 Unspecified dementia without behavioral disturbance: Secondary | ICD-10-CM | POA: Diagnosis present

## 2017-06-21 DIAGNOSIS — L409 Psoriasis, unspecified: Secondary | ICD-10-CM | POA: Diagnosis present

## 2017-06-21 DIAGNOSIS — I10 Essential (primary) hypertension: Secondary | ICD-10-CM | POA: Diagnosis present

## 2017-06-21 DIAGNOSIS — Z66 Do not resuscitate: Secondary | ICD-10-CM | POA: Diagnosis present

## 2017-06-21 DIAGNOSIS — R29810 Facial weakness: Secondary | ICD-10-CM | POA: Diagnosis present

## 2017-06-21 DIAGNOSIS — Z8673 Personal history of transient ischemic attack (TIA), and cerebral infarction without residual deficits: Secondary | ICD-10-CM | POA: Diagnosis not present

## 2017-06-21 DIAGNOSIS — Z96659 Presence of unspecified artificial knee joint: Secondary | ICD-10-CM | POA: Diagnosis present

## 2017-06-21 DIAGNOSIS — F329 Major depressive disorder, single episode, unspecified: Secondary | ICD-10-CM | POA: Diagnosis present

## 2017-06-21 DIAGNOSIS — E039 Hypothyroidism, unspecified: Secondary | ICD-10-CM | POA: Diagnosis present

## 2017-06-21 DIAGNOSIS — N390 Urinary tract infection, site not specified: Secondary | ICD-10-CM | POA: Diagnosis present

## 2017-06-21 DIAGNOSIS — E785 Hyperlipidemia, unspecified: Secondary | ICD-10-CM | POA: Diagnosis present

## 2017-06-21 DIAGNOSIS — R4182 Altered mental status, unspecified: Secondary | ICD-10-CM | POA: Diagnosis present

## 2017-06-21 LAB — COMPREHENSIVE METABOLIC PANEL
ALBUMIN: 3.6 g/dL (ref 3.5–5.0)
ALK PHOS: 57 U/L (ref 38–126)
ALT: 11 U/L — AB (ref 14–54)
ANION GAP: 11 (ref 5–15)
AST: 16 U/L (ref 15–41)
BUN: 17 mg/dL (ref 6–20)
CALCIUM: 8.8 mg/dL — AB (ref 8.9–10.3)
CHLORIDE: 101 mmol/L (ref 101–111)
CO2: 21 mmol/L — AB (ref 22–32)
Creatinine, Ser: 0.85 mg/dL (ref 0.44–1.00)
GFR calc Af Amer: >60 mL/min (ref >60)
GFR calc non Af Amer: 57 mL/min — ABNORMAL LOW (ref >60)
GLUCOSE: 129 mg/dL — AB (ref 65–99)
Potassium: 3.9 mmol/L (ref 3.5–5.1)
SODIUM: 133 mmol/L — AB (ref 135–145)
Total Bilirubin: 1 mg/dL (ref 0.3–1.2)
Total Protein: 7.2 g/dL (ref 6.5–8.1)

## 2017-06-21 LAB — CBC
HCT: 43.6 % (ref 35.0–47.0)
HEMATOCRIT: 39.5 % (ref 35.0–47.0)
HEMOGLOBIN: 13.9 g/dL (ref 12.0–16.0)
Hemoglobin: 14.9 g/dL (ref 12.0–16.0)
MCH: 29.8 pg (ref 26.0–34.0)
MCH: 30.4 pg (ref 26.0–34.0)
MCHC: 34.1 g/dL (ref 32.0–36.0)
MCHC: 35.1 g/dL (ref 32.0–36.0)
MCV: 86.7 fL (ref 80.0–100.0)
MCV: 87.4 fL (ref 80.0–100.0)
Platelets: 274 10*3/uL (ref 150–440)
Platelets: 324 10*3/uL (ref 150–440)
RBC: 4.56 MIL/uL (ref 3.80–5.20)
RBC: 4.99 MIL/uL (ref 3.80–5.20)
RDW: 13.3 % (ref 11.5–14.5)
RDW: 13.3 % (ref 11.5–14.5)
WBC: 11.9 10*3/uL — ABNORMAL HIGH (ref 3.6–11.0)
WBC: 14.4 10*3/uL — ABNORMAL HIGH (ref 3.6–11.0)

## 2017-06-21 LAB — URINALYSIS, COMPLETE (UACMP) WITH MICROSCOPIC
BILIRUBIN URINE: NEGATIVE
Glucose, UA: NEGATIVE mg/dL
KETONES UR: 5 mg/dL — AB
Nitrite: NEGATIVE
PROTEIN: 30 mg/dL — AB
SPECIFIC GRAVITY, URINE: 1.016 (ref 1.005–1.030)
SQUAMOUS EPITHELIAL / LPF: NONE SEEN
pH: 6 (ref 5.0–8.0)

## 2017-06-21 LAB — BASIC METABOLIC PANEL
ANION GAP: 11 (ref 5–15)
BUN: 18 mg/dL (ref 6–20)
CHLORIDE: 102 mmol/L (ref 101–111)
CO2: 21 mmol/L — ABNORMAL LOW (ref 22–32)
CREATININE: 0.78 mg/dL (ref 0.44–1.00)
Calcium: 8.2 mg/dL — ABNORMAL LOW (ref 8.9–10.3)
GFR calc non Af Amer: >60 mL/min (ref >60)
GLUCOSE: 123 mg/dL — AB (ref 65–99)
Potassium: 3.5 mmol/L (ref 3.5–5.1)
Sodium: 134 mmol/L — ABNORMAL LOW (ref 135–145)

## 2017-06-21 LAB — LACTIC ACID, PLASMA: LACTIC ACID, VENOUS: 1.2 mmol/L (ref 0.5–1.9)

## 2017-06-21 LAB — TROPONIN I

## 2017-06-21 MED ORDER — LEVOTHYROXINE SODIUM 50 MCG PO TABS
75.0000 ug | ORAL_TABLET | Freq: Every evening | ORAL | Status: DC
  Administered 2017-06-22: 05:00:00 75 ug via ORAL
  Filled 2017-06-21: qty 1

## 2017-06-21 MED ORDER — ENOXAPARIN SODIUM 40 MG/0.4ML ~~LOC~~ SOLN
40.0000 mg | SUBCUTANEOUS | Status: DC
  Administered 2017-06-21 – 2017-06-24 (×4): 40 mg via SUBCUTANEOUS
  Filled 2017-06-21 (×4): qty 0.4

## 2017-06-21 MED ORDER — OMEGA-3-ACID ETHYL ESTERS 1 G PO CAPS
1.0000 g | ORAL_CAPSULE | Freq: Every day | ORAL | Status: DC
  Administered 2017-06-21 – 2017-06-24 (×4): 1 g via ORAL
  Filled 2017-06-21 (×4): qty 1

## 2017-06-21 MED ORDER — SODIUM CHLORIDE 0.9 % IV SOLN
INTRAVENOUS | Status: DC
  Administered 2017-06-21 (×2): via INTRAVENOUS

## 2017-06-21 MED ORDER — ONDANSETRON HCL 4 MG/2ML IJ SOLN: 4.0000 mg | Freq: Four times a day (QID) | INTRAMUSCULAR | Status: DC | PRN

## 2017-06-21 MED ORDER — ACETAMINOPHEN 325 MG PO TABS
650.0000 mg | ORAL_TABLET | Freq: Four times a day (QID) | ORAL | Status: DC | PRN
  Administered 2017-06-21 – 2017-06-22 (×5): 650 mg via ORAL
  Filled 2017-06-21 (×4): qty 2

## 2017-06-21 MED ORDER — CEFTRIAXONE SODIUM IN DEXTROSE 20 MG/ML IV SOLN
1.0000 g | INTRAVENOUS | Status: DC
Start: 2017-06-21 — End: 2017-06-21
  Administered 2017-06-21: 1 g via INTRAVENOUS
  Filled 2017-06-21: qty 50

## 2017-06-21 MED ORDER — FAMOTIDINE 20 MG PO TABS
20.0000 mg | ORAL_TABLET | Freq: Every day | ORAL | Status: DC
  Administered 2017-06-21 – 2017-06-24 (×4): 20 mg via ORAL
  Filled 2017-06-21 (×4): qty 1

## 2017-06-21 MED ORDER — VITAMIN D 1000 UNITS PO TABS
2000.0000 [IU] | ORAL_TABLET | Freq: Every day | ORAL | Status: DC
  Administered 2017-06-21 – 2017-06-24 (×4): 2000 [IU] via ORAL
  Filled 2017-06-21 (×4): qty 2

## 2017-06-21 MED ORDER — VITAMIN B-12 1000 MCG PO TABS
1000.0000 ug | ORAL_TABLET | ORAL | Status: DC
  Administered 2017-06-21 – 2017-06-23 (×2): 1000 ug via ORAL
  Filled 2017-06-21 (×2): qty 1

## 2017-06-21 MED ORDER — CLOPIDOGREL BISULFATE 75 MG PO TABS
75.0000 mg | ORAL_TABLET | Freq: Every day | ORAL | Status: DC
  Administered 2017-06-21 – 2017-06-24 (×4): 75 mg via ORAL
  Filled 2017-06-21 (×4): qty 1

## 2017-06-21 MED ORDER — ACETAMINOPHEN 650 MG RE SUPP
650.0000 mg | Freq: Four times a day (QID) | RECTAL | Status: DC | PRN
  Filled 2017-06-21: qty 1

## 2017-06-21 MED ORDER — DICLOFENAC SODIUM 1 % TD GEL
2.0000 g | Freq: Two times a day (BID) | TRANSDERMAL | Status: DC
  Administered 2017-06-21 – 2017-06-24 (×7): 2 g via TOPICAL
  Filled 2017-06-21: qty 100

## 2017-06-21 MED ORDER — AMLODIPINE BESYLATE 5 MG PO TABS
5.0000 mg | ORAL_TABLET | Freq: Every day | ORAL | Status: DC
  Administered 2017-06-21 – 2017-06-24 (×4): 5 mg via ORAL
  Filled 2017-06-21 (×4): qty 1

## 2017-06-21 MED ORDER — BENAZEPRIL HCL 10 MG PO TABS
10.0000 mg | ORAL_TABLET | Freq: Every day | ORAL | Status: DC
  Administered 2017-06-21 – 2017-06-24 (×4): 10 mg via ORAL
  Filled 2017-06-21 (×4): qty 1

## 2017-06-21 MED ORDER — ATORVASTATIN CALCIUM 20 MG PO TABS
10.0000 mg | ORAL_TABLET | Freq: Every day | ORAL | Status: DC
  Administered 2017-06-21 – 2017-06-22 (×2): 10 mg via ORAL
  Filled 2017-06-21 (×2): qty 1

## 2017-06-21 MED ORDER — ONDANSETRON HCL 4 MG PO TABS: 4.0000 mg | ORAL_TABLET | Freq: Four times a day (QID) | ORAL | Status: DC | PRN

## 2017-06-21 MED ORDER — SENNOSIDES-DOCUSATE SODIUM 8.6-50 MG PO TABS: 1.0000 | ORAL_TABLET | Freq: Every evening | ORAL | Status: DC | PRN

## 2017-06-21 MED ORDER — ACETAMINOPHEN 325 MG PO TABS
ORAL_TABLET | ORAL | Status: AC
  Administered 2017-06-21: 650 mg via ORAL
  Filled 2017-06-21: qty 2

## 2017-06-21 MED ORDER — DEXTROSE 5 % IV SOLN
1.0000 g | INTRAVENOUS | Status: DC
  Administered 2017-06-21 – 2017-06-23 (×3): 1 g via INTRAVENOUS
  Filled 2017-06-21 (×4): qty 10

## 2017-06-21 MED ORDER — ESCITALOPRAM OXALATE 10 MG PO TABS
10.0000 mg | ORAL_TABLET | Freq: Every day | ORAL | Status: DC
  Administered 2017-06-21 – 2017-06-24 (×4): 10 mg via ORAL
  Filled 2017-06-21 (×4): qty 1

## 2017-06-21 NOTE — [place] FL2 (Signed)
[place] MEDICAID FL2 LEVEL OF CARE SCREENING TOOL     IDENTIFICATION  Patient Name: Alyssa King Birthdate: 07/01/1921 Sex: female Admission Date (Current Location): 06/20/2017  [place]   Facility and Address:  [facility], [address], [place], [place] [address]      Provider Number: [id number]  Attending Physician Name and Address:  Altamese DillingVachhani, Vaibhavkumar, *  Relative Name and Phone Number:       Current Level of Care: Hospital Recommended Level of Care: Skilled Nursing Facility  Prior Approval Number:    Date Approved/Denied:   PASRR Number: [id number] [id number]    Discharge Plan: Skilled Nursing Facility     Current Diagnoses: Patient Active Problem List   Diagnosis Date Noted  . Altered mental state 06/21/2017  . CVA (cerebral vascular accident) (HCC) 06/19/2017  . HTN, goal below 140/80 06/19/2017  . Gait disturbance 06/19/2017  . HLD (hyperlipidemia) 06/19/2017    Orientation RESPIRATION BLADDER Height & Weight     Self  Normal Continent Weight: 183 lb (83 kg) Height:  5\' 3"  (160 cm)  BEHAVIORAL SYMPTOMS/MOOD NEUROLOGICAL BOWEL NUTRITION STATUS      Continent Diet(Diet: 2 Grams Sodium. )  AMBULATORY STATUS COMMUNICATION OF NEEDS Skin   Extensive Assistance  Verbally Normal                       Personal Care Assistance Level of Assistance  Bathing, Feeding, Dressing Bathing Assistance: Limited assistance Feeding assistance: Independent Dressing Assistance: Limited assistance     Functional Limitations Info  Sight, Hearing, Speech Sight Info: Adequate Hearing Info: Adequate Speech Info: Adequate    SPECIAL CARE FACTORS FREQUENCY  PT (By licensed PT) and OT      PT and OT 5 times per week              Contractures      Additional Factors Info  Code Status, Allergies Code Status Info: (DNR ) Allergies Info: (No Known Allergies. )           Current Medications  (06/21/2017):  This is the current hospital active medication list Current Facility-Administered Medications  Medication Dose Route Frequency Provider Last Rate Last Dose  . 0.9 %  sodium chloride infusion   Intravenous Continuous Ihor AustinPyreddy, Pavan, MD 50 mL/hr at 06/21/17 0438    . acetaminophen (TYLENOL) tablet 650 mg  650 mg Oral Q6H PRN Ihor AustinPyreddy, Pavan, MD   650 mg at 06/21/17 1052   Or  . acetaminophen (TYLENOL) suppository 650 mg  650 mg Rectal Q6H PRN Pyreddy, Vivien RotaPavan, MD      . amLODipine (NORVASC) tablet 5 mg  5 mg Oral Daily Pyreddy, Pavan, MD   5 mg at 06/21/17 1049  . atorvastatin (LIPITOR) tablet 10 mg  10 mg Oral q1800 Pyreddy, Vivien RotaPavan, MD      . benazepril (LOTENSIN) tablet 10 mg  10 mg Oral Daily Pyreddy, Pavan, MD   10 mg at 06/21/17 1048  . cefTRIAXone (ROCEPHIN) 1 g in dextrose 5 % 50 mL IVPB  1 g Intravenous Q24H Ihor AustinPyreddy, Pavan, MD   Stopped at 06/21/17 1150  . cholecalciferol (VITAMIN D) tablet 2,000 Units  2,000 Units Oral Daily Ihor AustinPyreddy, Pavan, MD   2,000 Units at 06/21/17 1042  . clopidogrel (PLAVIX) tablet 75 mg  75 mg Oral Daily Pyreddy, Vivien RotaPavan, MD   75 mg at 06/21/17 1042  . diclofenac sodium (VOLTAREN) 1 % transdermal gel 2 g  2 g Topical BID Altamese DillingVachhani, Vaibhavkumar, MD   2 g at 06/21/17 1414  . enoxaparin (LOVENOX) injection 40 mg  40 mg Subcutaneous Q24H Ihor AustinPyreddy, Pavan, MD   40 mg at 06/21/17 0438  . escitalopram (LEXAPRO) tablet 10 mg  10 mg Oral Daily Pyreddy, Pavan, MD   10 mg at 06/21/17 1045  . famotidine (PEPCID) tablet 20 mg  20 mg Oral Daily Pyreddy, Vivien RotaPavan, MD   20 mg at 06/21/17 1042  . levothyroxine (SYNTHROID, LEVOTHROID) tablet 75 mcg  75 mcg Oral QPM Pyreddy, Pavan, MD      . omega-3 acid ethyl esters (LOVAZA) capsule 1 g  1 g Oral Daily Pyreddy, Pavan, MD   1 g at 06/21/17 1042  . ondansetron (ZOFRAN) tablet 4 mg  4 mg Oral Q6H PRN Pyreddy, Vivien RotaPavan, MD       Or  . ondansetron (ZOFRAN) injection 4 mg  4 mg Intravenous Q6H PRN Pyreddy, Pavan, MD      .  senna-docusate (Senokot-S) tablet 1 tablet  1 tablet Oral QHS PRN Pyreddy, Vivien RotaPavan, MD      . vitamin B-12 (CYANOCOBALAMIN) tablet 1,000 mcg  1,000 mcg Oral Q M,W,F Ihor AustinPyreddy, Pavan, MD   1,000 mcg at 06/21/17 1042     Discharge Medications: Please see discharge summary for a list of discharge medications.  Relevant Imaging Results:  Relevant Lab Results:   Additional Information (SSN: [id number])  Arcangel Minion, Darleen CrockerBailey M, LCSW

## 2017-06-21 NOTE — ED Notes (Signed)
Patient transported to CT 

## 2017-06-21 NOTE — ED Provider Notes (Signed)
[facility] Emergency Department Provider Note   ____________________________________________   First MD Initiated Contact with Patient 06/20/17 2346     (approximate)  I have reviewed the triage vital signs and the nursing notes.   HISTORY  Chief Complaint Altered Mental Status    HPI Alyssa King is a 83 y.o. female [facility] with a chief complaint of unresponsive.  Patient with a history of hypertension, hyperlipidemia with recent hospitalization 11/24-11/25 for left LE weakness and left facial droop with CT demonstrating acute basal ganglion infarct with no bleed.  She was changed from aspirin to Plavix and discharged back to the facility yesterday.  EMS reports staff rounded on patient tonight and found her unresponsive in her recliner.  EMS reports patient was alert and oriented upon their arrival.  Nursing staff reports no change in patient's mild left facial droop.  Patient voices no complaints. Specifically, patient denies headache, neck pain, vision changes, extremity weakness, numbness or tingling, chest pain, shortness of breath, abdominal pain, nausea or vomiting.  Denies recent travel or trauma.     Past Medical History:  Diagnosis Date  . Arthritis   . Cataract   . Depression   . GERD (gastroesophageal reflux disease)   . Hyperlipidemia   . Hypertension   . Psoriasis   . Stroke (HCC)   . Thyroid disease     Patient Active Problem List   Diagnosis Date Noted  . CVA (cerebral vascular accident) (HCC) 06/19/2017  . HTN, goal below 140/80 06/19/2017  . Gait disturbance 06/19/2017  . HLD (hyperlipidemia) 06/19/2017    Past Surgical History:  Procedure Laterality Date  . REPLACEMENT TOTAL KNEE      Prior to Admission medications   Medication Sig Start Date End Date Taking? Authorizing Provider  acetaminophen (TYLENOL) 325 MG tablet Take 650 mg by mouth 2 (two) times daily. Pt is also able to take  twice daily as needed for pain in between scheduled doses.    [provider]  amLODipine (NORVASC) 5 MG tablet Take 5 mg by mouth daily.    [provider]  atorvastatin (LIPITOR) 10 MG tablet Take 1 tablet (10 mg total) by mouth daily at 6 PM. 06/20/17   Adrian SaranMody, Sital, MD  benazepril (LOTENSIN) 10 MG tablet Take 10 mg by mouth daily.    [provider]  cholecalciferol (VITAMIN D) 1000 units tablet Take 2,000 Units by mouth daily.    [provider]  clopidogrel (PLAVIX) 75 MG tablet Take 1 tablet (75 mg total) by mouth daily. 06/20/17   Adrian SaranMody, Sital, MD  diclofenac sodium (VOLTAREN) 1 % GEL Apply 2 g topically 2 (two) times daily. Pt applies to both legs.    [provider]  escitalopram (LEXAPRO) 10 MG tablet Take 10 mg by mouth daily.    [provider]  levothyroxine (SYNTHROID, LEVOTHROID) 75 MCG tablet Take 75 mcg by mouth every evening.     [provider]  Omega-3 Fatty Acids (FISH OIL) 1000 MG CAPS Take 1,000 mg by mouth daily.    [provider]  ranitidine (ZANTAC) 150 MG tablet Take 150 mg by mouth at bedtime.    [provider]  triamterene-hydrochlorothiazide (MAXZIDE-25) 37.5-25 MG tablet Take 0.5 tablets by mouth daily.    [provider]  vitamin B-12 (CYANOCOBALAMIN) 1000 MCG tablet Take 1,000 mcg by mouth every Monday, Wednesday, and Friday.    [provider]  Allergies Patient has no known allergies.  No family history on file.  Social History Social History   Tobacco Use  . Smoking status: Never Smoker  . Smokeless tobacco: Never Used  Substance Use Topics  . Alcohol use: No  . Drug use: No    Review of Systems  Constitutional: Positive for unresponsive state.  No fever/chills. Eyes: No visual changes. ENT: No sore throat. Cardiovascular: Denies chest pain. Respiratory: Denies shortness of breath. Gastrointestinal: No abdominal pain.  No nausea, no vomiting.   No diarrhea.  No constipation. Genitourinary: Negative for dysuria. Musculoskeletal: Negative for back pain. Skin: Negative for rash. Neurological: Negative for headaches, focal weakness or numbness.   ____________________________________________   PHYSICAL EXAM:  VITAL SIGNS: ED Triage Vitals  Enc Vitals Group     BP 06/20/17 2352 (!) 148/103     Pulse Rate 06/20/17 2352 82     Resp 06/20/17 2352 20     Temp 06/20/17 2352 98.4 F (36.9 C)     Temp Source 06/20/17 2352 Oral     SpO2 06/20/17 2350 97 %     Weight 06/20/17 2350 183 lb (83 kg)     Height 06/20/17 2350 5\' 3"  (1.6 m)     Head Circumference --      Peak Flow --      Pain Score --      Pain Loc --      Pain Edu? --      Excl. in GC? --     Constitutional: Alert and oriented. Well appearing and in no acute distress. Eyes: Conjunctivae are normal. PERRL. EOMI. Head: Atraumatic. Nose: No congestion/rhinnorhea. Mouth/Throat: Mucous membranes are moist.  Oropharynx non-erythematous. Neck: No stridor.  No carotid bruits. Cardiovascular: Normal rate, regular rhythm. Grossly normal heart sounds.  Good peripheral circulation. Respiratory: Normal respiratory effort.  No retractions. Lungs with audible wheezing. Gastrointestinal: Soft and nontender. No distention. No abdominal bruits. No CVA tenderness. Musculoskeletal: No lower extremity tenderness nor edema.  No joint effusions. Neurologic: Alert and oriented to person and place.  Very mild left facial droop noted.  Normal speech and language. No gross focal neurologic deficits are appreciated. MAEx4.  Skin:  Skin is warm, dry and intact. No rash noted. Psychiatric: Mood and affect are normal. Speech and behavior are normal.  ____________________________________________   LABS (all labs ordered are listed, but only abnormal results are displayed)  Labs Reviewed  COMPREHENSIVE METABOLIC PANEL - Abnormal; Notable for the following components:      Result Value    Sodium 133 (*)    CO2 21 (*)    Glucose, Bld 129 (*)    Calcium 8.8 (*)    ALT 11 (*)    GFR calc non Af Amer 57 (*)    All other components within normal limits  CBC - Abnormal; Notable for the following components:   WBC 14.4 (*)    All other components within normal limits  URINALYSIS, COMPLETE (UACMP) WITH MICROSCOPIC - Abnormal; Notable for the following components:   Color, Urine AMBER (*)    APPearance CLOUDY (*)    Hgb urine dipstick SMALL (*)    Ketones, ur 5 (*)    Protein, ur 30 (*)    Leukocytes, UA MODERATE (*)    Bacteria, UA MANY (*)    All other components within normal limits  URINE CULTURE  TROPONIN I  LACTIC ACID, PLASMA  CBG MONITORING, ED   ____________________________________________  EKG  ED ECG REPORT  I, Irean HongSUNG,Nixon Sparr J, the attending physician, personally viewed and interpreted this ECG.   Date: 06/21/2017  EKG Time: 2352  Rate: 83  Rhythm: normal EKG, normal sinus rhythm  Axis: Normal  Intervals:none  ST&T Change: Nonspecific  ____________________________________________  RADIOLOGY  Ct Head Wo Contrast  Result Date: 06/21/2017 CLINICAL DATA:  Patient was unresponsive tonight.  Recent stroke. EXAM: CT HEAD WITHOUT CONTRAST TECHNIQUE: Contiguous axial images were obtained from the base of the skull through the vertex without intravenous contrast. COMPARISON:  06/19/2017 FINDINGS: Brain: Progressing an enlarging area of low-attenuation in the right basal ganglia consistent with expected evolution of acute right basal ganglia infarct. There is mild associated effacement of the anterior horn of the right lateral ventricle. There is no developing acute hemorrhage. Diffuse cerebral atrophy. Patchy low-attenuation changes in the deep white matter consistent with small vessel ischemia. Mild ventricular dilatation consistent with central atrophy. No midline shift. Gray-white matter junctions are distinct. Basal cisterns are not effaced. No abnormal  extra-axial fluid collections. Vascular: Internal carotid and vertebrobasilar artery calcifications are demonstrated. Skull: The calvarium appears intact. Sinuses/Orbits: Paranasal sinuses and mastoid air cells are clear. Other: None. IMPRESSION: Enlarging area of low-attenuation in the right basal ganglia consistent with expected evolution of acute right basal ganglia infarct since previous study. Mild effacement of the anterior horn of the right lateral ventricle. No acute intracranial hemorrhage. Chronic atrophy and small vessel ischemic changes. Electronically Signed   By: Burman NievesWilliam  Stevens M.D.   On: 06/21/2017 00:27   Dg Chest Port 1 View  Result Date: 06/21/2017 CLINICAL DATA:  Altered mental status EXAM: PORTABLE CHEST 1 VIEW COMPARISON:  11/20/2015 FINDINGS: Stable left pleural scarring. No consolidation. Stable cardiomediastinal silhouette with atherosclerosis. No pneumothorax. Surgical clips at the thoracic inlet. Streaky bibasilar scarring. IMPRESSION: No active disease.  Bibasilar scarring. Electronically Signed   By: Jasmine PangKim  Fujinaga M.D.   On: 06/21/2017 00:38    ____________________________________________   PROCEDURES  Procedure(s) performed:   NIH Stroke Scale   Time: 2:22 AM Person Administering Scale: Keryn Nessler J  Administer stroke scale items in the order listed. Record performance in each category after each subscale exam. Do not go back and change scores. Follow directions provided for each exam technique. Scores should reflect what the patient does, not what the clinician thinks the patient can do. The clinician should record answers while administering the exam and work quickly. Except where indicated, the patient should not be coached (i.e., repeated requests to patient to make a special effort).   1a  Level of consciousness: 0=alert; keenly responsive  1b. LOC questions:  0=Performs both tasks correctly  1c. LOC commands: 0=Performs both tasks correctly  2.  Best  Gaze: 0=normal  3.  Visual: 0=No visual loss  4. Facial Palsy: 1=Minor paralysis (flattened nasolabial fold, asymmetric on smiling)  5a.  Motor left arm: 0=No drift, limb holds 90 (or 45) degrees for full 10 seconds  5b.  Motor right arm: 0=No drift, limb holds 90 (or 45) degrees for full 10 seconds  6a. motor left leg: 0=No drift, limb holds 90 (or 45) degrees for full 10 seconds  6b  Motor right leg:  0=No drift, limb holds 90 (or 45) degrees for full 10 seconds  7. Limb Ataxia: 0=Absent  8.  Sensory: 0=Normal; no sensory loss  9. Best Language:  0=No aphasia, normal  10. Dysarthria: 0=Normal  11. Extinction and Inattention: 0=No abnormality  12. Distal motor function: 0=Normal   Total:   1  Procedures  Critical Care performed: No  ____________________________________________   INITIAL IMPRESSION / ASSESSMENT AND PLAN / ED COURSE  As part of my medical decision making, I reviewed the following data within the electronic MEDICAL RECORD NUMBER Nursing notes reviewed and incorporated, Labs reviewed, EKG interpreted, Old chart reviewed, Radiograph reviewed and Notes from prior ED visits.   83 year old female recently admitted for acute basal ganglion stroke who was sent from nursing facility for unresponsive state. Differential diagnosis includes but is not limited to acute ICH, CVA, CAD, non-STEMI, metabolic and infectious etiologies.  Patient arrives alert, oriented with symmetrical strength and sensation in all extremities.  Code stroke was not initiated given nursing staff sent patient for unresponsive state and NIH stroke scale of 1.  Well obtain screening lab work including troponin, urinalysis, CT head, chest x-ray for wheezing heard on exam and reassess.  Clinical Course as of Jun 21 221  Mon Jun 21, 2017  0221 Updated patient on urinalysis results.  No family at bedside.  Patient is seen talking to her [place] sandwich.  Intermittently confused.  Will discuss with hospitalist to  evaluate patient in the emergency department for admission.  [JS]    Clinical Course User Index [JS] Irean Hong, MD     ____________________________________________   FINAL CLINICAL IMPRESSION(S) / ED DIAGNOSES  Final diagnoses:  Altered mental status, unspecified altered mental status type  Lower urinary tract infectious disease  Delirium     ED Discharge Orders    None       Note:  This document was prepared using Dragon voice recognition software and may include unintentional dictation errors.    Irean Hong, MD 06/21/17 [phone]

## 2017-06-21 NOTE — Progress Notes (Signed)
[employer] [facility] [facility]   PATIENT NAME: Alyssa PaddyDorothy King    MR#:  [id number]  DATE OF BIRTH:  11/25/1920  SUBJECTIVE:  CHIEF COMPLAINT:   Chief Complaint  Patient presents with  . Altered Mental Status     Recent d/c with the stroke- at NH was very drowsy and sent back. Found to have UTI. After IV fluids and Abx, more alert today. Still some confusion.  REVIEW OF SYSTEMS:   She have some confusion and may have some baseline dementia.  ROS  DRUG ALLERGIES:  No Known Allergies  VITALS:  Blood pressure (!) 139/57, pulse 76, temperature 98.4 F (36.9 C), temperature source Oral, resp. rate 20, height 5\' 3"  (1.6 m), weight 83 kg (183 lb), SpO2 96 %.  PHYSICAL EXAMINATION:  GENERAL:  83 y.o.-year-old patient lying in the bed with no acute distress.  EYES: Pupils equal, round, reactive to light and accommodation. No scleral icterus. Extraocular muscles intact.  HEENT: Head atraumatic, normocephalic. Oropharynx and nasopharynx clear.  NECK:  Supple, no jugular venous distention. No thyroid enlargement, no tenderness.  LUNGS: Normal breath sounds bilaterally, no wheezing, rales,rhonchi or crepitation. No use of accessory muscles of respiration.  CARDIOVASCULAR: S1, S2 normal. No murmurs, rubs, or gallops.  ABDOMEN: Soft, nontender, nondistended. Bowel sounds present. No organomegaly or mass.  EXTREMITIES: No pedal edema, cyanosis, or clubbing.  NEUROLOGIC: Cranial nerves II through XII are intact. Muscle strength 3-4/5 in all extremities. Sensation intact. Gait not checked.  PSYCHIATRIC: The patient is alert and oriented x 1.  SKIN: No obvious rash, lesion, or ulcer.   Physical Exam LABORATORY PANEL:   CBC Recent Labs  Lab 06/21/17 0630  WBC 11.9*  HGB 13.9  HCT 39.5  PLT 274   ------------------------------------------------------------------------------------------------------------------  Chemistries  Recent Labs  Lab 06/20/17 2348  06/21/17 0630  NA 133* 134*  K 3.9 3.5  CL 101 102  CO2 21* 21*  GLUCOSE 129* 123*  BUN 17 18  CREATININE 0.85 0.78  CALCIUM 8.8* 8.2*  AST 16  --   ALT 11*  --   ALKPHOS 57  --   BILITOT 1.0  --    ------------------------------------------------------------------------------------------------------------------  Cardiac Enzymes Recent Labs  Lab 06/19/17 1111 06/20/17 2348  TROPONINI <0.03 <0.03   ------------------------------------------------------------------------------------------------------------------  RADIOLOGY:  Ct Head Wo Contrast  Result Date: 06/21/2017 CLINICAL DATA:  Patient was unresponsive tonight.  Recent stroke. EXAM: CT HEAD WITHOUT CONTRAST TECHNIQUE: Contiguous axial images were obtained from the base of the skull through the vertex without intravenous contrast. COMPARISON:  06/19/2017 FINDINGS: Brain: Progressing an enlarging area of low-attenuation in the right basal ganglia consistent with expected evolution of acute right basal ganglia infarct. There is mild associated effacement of the anterior horn of the right lateral ventricle. There is no developing acute hemorrhage. Diffuse cerebral atrophy. Patchy low-attenuation changes in the deep white matter consistent with small vessel ischemia. Mild ventricular dilatation consistent with central atrophy. No midline shift. Gray-white matter junctions are distinct. Basal cisterns are not effaced. No abnormal extra-axial fluid collections. Vascular: Internal carotid and vertebrobasilar artery calcifications are demonstrated. Skull: The calvarium appears intact. Sinuses/Orbits: Paranasal sinuses and mastoid air cells are clear. Other: None. IMPRESSION: Enlarging area of low-attenuation in the right basal ganglia consistent with expected evolution of acute right basal ganglia infarct since previous study. Mild effacement of the anterior horn of the right lateral ventricle. No acute intracranial hemorrhage. Chronic  atrophy and small vessel ischemic changes. Electronically Signed  By: Burman NievesWilliam  Stevens M.D.   On: 06/21/2017 00:27   [place] Carotid Bilateral  Result Date: 06/20/2017 CLINICAL DATA:  83 year old female with a history of TIA. Cardiovascular risk factors include hypertension, hyperlipidemia EXAM: BILATERAL CAROTID DUPLEX ULTRASOUND TECHNIQUE: Wallace CullensGray scale imaging, color Doppler and duplex ultrasound were performed of bilateral carotid and vertebral arteries in the neck. COMPARISON:  04/01/2011 FINDINGS: Criteria: Quantification of carotid stenosis is based on velocity parameters that correlate the residual internal carotid diameter with NASCET-based stenosis levels, using the diameter of the distal internal carotid lumen as the denominator for stenosis measurement. The following velocity measurements were obtained: RIGHT ICA:  Systolic 53 cm/sec, Diastolic 13 cm/sec CCA:  57 cm/sec SYSTOLIC ICA/CCA RATIO:  0.9 ECA:  110 cm/sec LEFT ICA:  Systolic 75 cm/sec, Diastolic 21 cm/sec CCA:  76 cm/sec SYSTOLIC ICA/CCA RATIO:  1.0 ECA:  140 cm/sec Right Brachial SBP: Not acquired Left Brachial SBP: Not acquired RIGHT CAROTID ARTERY: No significant calcified disease of the right common carotid artery. Intermediate waveform maintained. Heterogeneous plaque without significant calcifications at the right carotid bifurcation. Low resistance waveform of the right ICA. Mild tortuosity. RIGHT VERTEBRAL ARTERY: Antegrade flow with low resistance waveform. LEFT CAROTID ARTERY: No significant calcified disease of the left common carotid artery. Intermediate waveform maintained. Heterogeneous plaque at the left carotid bifurcation without significant calcifications. Low resistance waveform of the left ICA. LEFT VERTEBRAL ARTERY:  Antegrade flow with low resistance waveform. IMPRESSION: Color duplex indicates minimal heterogeneous plaque, with no hemodynamically significant stenosis by duplex criteria in the extracranial cerebrovascular  circulation. Signed, Yvone NeuJaime S. Loreta AveWagner, DO [facility] Electronically Signed   By: Gilmer MorJaime  Wagner D.O.   On: 06/20/2017 08:12   Dg Chest Port 1 View  Result Date: 06/21/2017 CLINICAL DATA:  Altered mental status EXAM: PORTABLE CHEST 1 VIEW COMPARISON:  11/20/2015 FINDINGS: Stable left pleural scarring. No consolidation. Stable cardiomediastinal silhouette with atherosclerosis. No pneumothorax. Surgical clips at the thoracic inlet. Streaky bibasilar scarring. IMPRESSION: No active disease.  Bibasilar scarring. Electronically Signed   By: Jasmine PangKim  Fujinaga M.D.   On: 06/21/2017 00:38    ASSESSMENT AND PLAN:   Active Problems:   Altered mental state   * Altered mental status- metabolic encephalopathy   UTI   IV rocephin, cx sent.   IV fluids, some improved.  * recent stroke   Atorvastatin, Plavix.   Need PT eval again with UTI now,.  * Htn   Cont amlodipine.  * Hypothyroidism   Cont levothyroxine  * Dementia      All the records are reviewed and case discussed with Care Management/Social Workerr. Management plans discussed with the patient, family and they are in agreement.  CODE STATUS: DNR  TOTAL TIME TAKING CARE OF THIS PATIENT: 35 minutes.    POSSIBLE D/C IN 1-2 DAYS, DEPENDING ON CLINICAL CONDITION.   Altamese DillingVaibhavkumar Ishaaq Penna M.D on 06/21/2017   Between 7am to 6pm - Pager - [phone]  After 6pm go to [email or web address] - password [email or web address]  [facility]  Office  [phone]  CC: Primary care physician; Living[facility]  Note: This dictation was prepared with Dragon dictation along with smaller phrase technology. Any transcriptional errors that result from this process are unintentional.

## 2017-06-21 NOTE — Progress Notes (Signed)
Pharmacy Antibiotic Note  Alyssa King is a 83 y.o. female admitted on 06/20/2017 with UTI.  Pharmacy has been consulted for ceftriaxone dosing.  Plan: Patient is being started on ceftriaxone 1g IV daily for 5 days.  Height: 5\' 3"  (160 cm) Weight: 183 lb (83 kg) IBW/kg (Calculated) : 52.4  Temp (24hrs), Avg:98.4 F (36.9 C), Min:98 F (36.7 C), Max:98.9 F (37.2 C)  Recent Labs  Lab 06/19/17 1105 06/20/17 0336 06/20/17 2348 06/20/17 2358  WBC 9.5 8.8 14.4*  --   CREATININE 0.72 0.61 0.85  --   LATICACIDVEN  --   --   --  1.2    Estimated Creatinine Clearance: 41.3 mL/min (by C-G formula based on SCr of 0.85 mg/dL).    No Known Allergies  Thank you for allowing pharmacy to be a part of this patient's care.  Thomasene Rippleavid Sruthi Maurer, PharmD, BCPS Clinical Pharmacist 06/21/2017

## 2017-06-21 NOTE — ED Notes (Signed)
Pt resting in bed, eating sandwich tray;

## 2017-06-21 NOTE — Clinical Social Work Note (Signed)
Clinical Social Work Assessment  Patient Details  Name: Alyssa King MRN: [id number] Date of Birth: 02/10/1921  Date of referral:  06/21/17               Reason for consult:  Other (Comment Required)(From Alyssa King )                Permission sought to share information with:  Oceanographeracility Contact Representative Permission granted to share information::  Yes, Verbal Permission Granted  Name::      Alyssa King   Agency::     Relationship::     Contact Information:     Housing/Transportation Living arrangements for the past 2 months:  Assisted Living Facility Source of Information:  Adult Children Patient Interpreter Needed:  None Criminal Activity/Legal Involvement Pertinent to Current Situation/Hospitalization:  No - Comment as needed Significant Relationships:  Adult Children Lives with:  Facility Resident Do you feel safe going back to the place where you live?  Yes Need for family participation in patient care:  Yes (Comment)  Care giving concerns:  [facility] (fax: [phone]).    Social Worker assessment / plan:  Visual merchandiserClinical Social Worker (CSW) reviewed chart and noted that patient is from [facility]. CSW contacted [facility] and spoke to H&R Blockdirector Alyssa King. Per Alyssa King patient [facility] since [facility], walks with a rollaider at baseline and is on room air. Per Alyssa King [facility]. CSW contacted patient's daughter Alyssa King and made her aware of above. Per Alyssa King patient has no HPOA and one of her adult children will [facility]. CSW will continue to follow and assist as needed.     Employment status:  Retired, Disabled (Comment on whether or not currently receiving Disability) Insurance information:  Medicare PT Recommendations:  Not assessed at this time Information / Referral to community resources:  Other (Comment Required)(Patient will ret[facility] to [facility]  )  Patient/Family's Response to care: Patient's daughter Alyssa King is agreeable for patient to [facility].    Patient/Family's Understanding of and Emotional Response to Diagnosis, Current Treatment, and Prognosis:  Patient's daughter Alyssa King was very pleasant and thanked CSW for assistance.    Emotional Assessment Appearance:  Appears stated age Attitude/Demeanor/Rapport:    Affect (typically observed):  Pleasant Orientation:  Oriented to Self Alcohol / Substance use:  Not Applicable Psych involvement (Current and /or in the community):  No (Comment)  Discharge Needs  Concerns to be addressed:  Discharge Planning Concerns Readmission within the last 30 days:  No Current discharge risk:  Chronically ill Barriers to Discharge:  Continued Medical Work up   [employer], Darleen CrockerBailey M, LCSW 06/21/2017, 5:25 PM

## 2017-06-21 NOTE — H&P (Addendum)
[facility] [place] [facility]   PATIENT NAME: Alyssa PaddyDorothy Quesenberry    MR#:  [id number]  DATE OF BIRTH:  05/07/1921  DATE OF ADMISSION:  06/20/2017  PRIMARY CARE PHYSICIAN: [facility]   REQUESTING/REFERRING PHYSICIAN:   CHIEF COMPLAINT:   Chief Complaint  Patient presents with  . Altered Mental Status    HISTORY OF PRESENT ILLNESS: Alyssa King  is a 83 y.o. female with a known history of arthritis, GERD, hyperlipidemia, hypertension, CVA basal ganglia, thyroid disease was referred from the facility for confusion. Patient slumped over in the recliner and was confused. Patient was evaluated in the emergency room was found to have urinary tract infection. She was worked up with a CT head which showed the old basal ganglia CVA but no acute abnormality. Patient was recently discharged 1 day ago to the facility from our hospital after being managed for stroke. Has some low-grade fever and dysuria. No complaints of any chest pain, shortness of breath. Hospitalist service was consulted for further care.  PAST MEDICAL HISTORY:   Past Medical History:  Diagnosis Date  . Arthritis   . Cataract   . Depression   . GERD (gastroesophageal reflux disease)   . Hyperlipidemia   . Hypertension   . Psoriasis   . Stroke (HCC)   . Thyroid disease     PAST SURGICAL HISTORY:  Past Surgical History:  Procedure Laterality Date  . REPLACEMENT TOTAL KNEE      SOCIAL HISTORY:  Social History   Tobacco Use  . Smoking status: Never Smoker  . Smokeless tobacco: Never Used  Substance Use Topics  . Alcohol use: No    FAMILY HISTORY:  Family History  Problem Relation Age of Onset  . CAD Neg Hx   . Hypertension Neg Hx     DRUG ALLERGIES: No Known Allergies  REVIEW OF SYSTEMS:  Could not be obtained as patient is confused  MEDICATIONS AT HOME:  Prior to Admission medications   Medication Sig Start Date End Date Taking? Authorizing Provider   amLODipine (NORVASC) 5 MG tablet Take 5 mg by mouth daily.   Yes [provider]  atorvastatin (LIPITOR) 10 MG tablet Take 1 tablet (10 mg total) by mouth daily at 6 PM. 06/20/17  Yes Mody, Sital, MD  benazepril (LOTENSIN) 10 MG tablet Take 10 mg by mouth daily.   Yes [provider]  cholecalciferol (VITAMIN D) 1000 units tablet Take 2,000 Units by mouth daily.   Yes [provider]  clopidogrel (PLAVIX) 75 MG tablet Take 1 tablet (75 mg total) by mouth daily. 06/20/17  Yes Mody, Patricia PesaSital, MD  diclofenac sodium (VOLTAREN) 1 % GEL Apply 2 g topically 2 (two) times daily. Pt applies to both legs.   Yes [provider]  escitalopram (LEXAPRO) 10 MG tablet Take 10 mg by mouth daily.   Yes [provider]  levothyroxine (SYNTHROID, LEVOTHROID) 75 MCG tablet Take 75 mcg by mouth every evening.    Yes [provider]  Omega-3 Fatty Acids (FISH OIL) 1000 MG CAPS Take 1,000 mg by mouth daily.   Yes [provider]  ranitidine (ZANTAC) 150 MG tablet Take 150 mg by mouth at bedtime.   Yes [provider]  triamterene-hydrochlorothiazide (MAXZIDE-25) 37.5-25 MG tablet Take 0.5 tablets by mouth daily.   Yes [provider]  vitamin B-12 (CYANOCOBALAMIN) 1000 MCG tablet Take 1,000 mcg by mouth every Monday, Wednesday, and Friday.   Yes [provider]  acetaminophen (TYLENOL) 325 MG tablet Take 650 mg by mouth 2 (two) times daily. Pt is also able to take twice daily as needed for pain in between scheduled doses.    [provider]      PHYSICAL EXAMINATION:   VITAL SIGNS: Blood pressure (!) 142/110, pulse 87, temperature 98.4 F (36.9 C), temperature source Oral, resp. rate (!) 23, height 5\' 3"  (1.6 m), weight 83 kg (183 lb), SpO2 95 %.  GENERAL:  83 y.o.-year-old patient lying in the bed with no acute distress.  EYES: Pupils equal, round, reactive to light and accommodation. No scleral icterus. Extraocular  muscles intact.  HEENT: Head atraumatic, normocephalic. Oropharynx and nasopharynx clear.  NECK:  Supple, no jugular venous distention. No thyroid enlargement, no tenderness.  LUNGS: Normal breath sounds bilaterally, no wheezing, rales,rhonchi or crepitation. No use of accessory muscles of respiration.  CARDIOVASCULAR: S1, S2 normal. No murmurs, rubs, or gallops.  ABDOMEN: Soft, nontender, nondistended. Bowel sounds present. No organomegaly or mass.  EXTREMITIES: No pedal edema, cyanosis, or clubbing.  NEUROLOGIC: Cranial nerves II through XII are intact. Muscle strength 5/5 in all extremities. Sensation intact. Gait not checked.  PSYCHIATRIC: The patient is alert and oriented x 3.  SKIN: No obvious rash, lesion, or ulcer.   LABORATORY PANEL:   CBC Recent Labs  Lab 06/19/17 1105 06/20/17 0336 06/20/17 2348  WBC 9.5 8.8 14.4*  HGB 13.7 13.9 14.9  HCT 39.9 40.4 43.6  PLT 309 287 324  MCV 87.2 87.1 87.4  MCH 29.9 30.0 29.8  MCHC 34.3 34.4 34.1  RDW 13.3 13.2 13.3   ------------------------------------------------------------------------------------------------------------------  Chemistries  Recent Labs  Lab 06/19/17 1105 06/20/17 0336 06/20/17 2348  NA 135 136 133*  K 3.3* 3.6 3.9  CL 100* 103 101  CO2 25 26 21*  GLUCOSE 137* 112* 129*  BUN 17 13 17   CREATININE 0.72 0.61 0.85  CALCIUM 8.1* 8.0* 8.8*  AST  --  16 16  ALT  --  12* 11*  ALKPHOS  --  53 57  BILITOT  --  0.8 1.0   ------------------------------------------------------------------------------------------------------------------ estimated creatinine clearance is 41.3 mL/min (by C-G formula based on SCr of 0.85 mg/dL). ------------------------------------------------------------------------------------------------------------------ No results for input(s): TSH, T4TOTAL, T3FREE, THYROIDAB in the last 72 hours.  Invalid input(s): FREET3   Coagulation profile Recent Labs  Lab 06/19/17 1111  INR 1.01    ------------------------------------------------------------------------------------------------------------------- No results for input(s): DDIMER in the last 72 hours. -------------------------------------------------------------------------------------------------------------------  Cardiac Enzymes Recent Labs  Lab 06/19/17 1111 06/20/17 2348  TROPONINI <0.03 <0.03   ------------------------------------------------------------------------------------------------------------------ Invalid input(s): POCBNP  ---------------------------------------------------------------------------------------------------------------  Urinalysis    Component Value Date/Time   COLORURINE AMBER (A) 06/20/2017 2358   APPEARANCEUR CLOUDY (A) 06/20/2017 2358   LABSPEC 1.016 06/20/2017 2358   PHURINE 6.0 06/20/2017 2358   GLUCOSEU NEGATIVE 06/20/2017 2358   HGBUR SMALL (A) 06/20/2017 2358   BILIRUBINUR NEGATIVE 06/20/2017 2358   KETONESUR 5 (A) 06/20/2017 2358   PROTEINUR 30 (A) 06/20/2017 2358   UROBILINOGEN 0.2 04/17/2008 1020   NITRITE NEGATIVE 06/20/2017 2358   LEUKOCYTESUR MODERATE (A) 06/20/2017 2358     RADIOLOGY: Ct Head Wo Contrast  Result Date: 06/21/2017 CLINICAL DATA:  Patient was unresponsive tonight.  Recent stroke. EXAM: CT HEAD WITHOUT CONTRAST TECHNIQUE: Contiguous axial images were obtained from the base of the skull through the vertex without intravenous contrast. COMPARISON:  06/19/2017 FINDINGS: Brain: Progressing an enlarging area of low-attenuation in the right basal ganglia consistent with expected evolution of  acute right basal ganglia infarct. There is mild associated effacement of the anterior horn of the right lateral ventricle. There is no developing acute hemorrhage. Diffuse cerebral atrophy. Patchy low-attenuation changes in the deep white matter consistent with small vessel ischemia. Mild ventricular dilatation consistent with central atrophy. No midline shift.  Gray-white matter junctions are distinct. Basal cisterns are not effaced. No abnormal extra-axial fluid collections. Vascular: Internal carotid and vertebrobasilar artery calcifications are demonstrated. Skull: The calvarium appears intact. Sinuses/Orbits: Paranasal sinuses and mastoid air cells are clear. Other: None. IMPRESSION: Enlarging area of low-attenuation in the right basal ganglia consistent with expected evolution of acute right basal ganglia infarct since previous study. Mild effacement of the anterior horn of the right lateral ventricle. No acute intracranial hemorrhage. Chronic atrophy and small vessel ischemic changes. Electronically Signed   By: Burman Nieves M.D.   On: 06/21/2017 00:27   US Carotid Bilateral  Result Date: 06/20/2017 CLINICAL DATA:  83 year old female with a history of TIA. Cardiovascular risk factors include hypertension, hyperlipidemia EXAM: BILATERAL CAROTID DUPLEX ULTRASOUND TECHNIQUE: Wallace Cullens scale imaging, color Doppler and duplex ultrasound were performed of bilateral carotid and vertebral arteries in the neck. COMPARISON:  04/01/2011 FINDINGS: Criteria: Quantification of carotid stenosis is based on velocity parameters that correlate the residual internal carotid diameter with NASCET-based stenosis levels, using the diameter of the distal internal carotid lumen as the denominator for stenosis measurement. The following velocity measurements were obtained: RIGHT ICA:  Systolic 53 cm/sec, Diastolic 13 cm/sec CCA:  57 cm/sec SYSTOLIC ICA/CCA RATIO:  0.9 ECA:  110 cm/sec LEFT ICA:  Systolic 75 cm/sec, Diastolic 21 cm/sec CCA:  76 cm/sec SYSTOLIC ICA/CCA RATIO:  1.0 ECA:  140 cm/sec Right Brachial SBP: Not acquired Left Brachial SBP: Not acquired RIGHT CAROTID ARTERY: No significant calcified disease of the right common carotid artery. Intermediate waveform maintained. Heterogeneous plaque without significant calcifications at the right carotid bifurcation. Low resistance  waveform of the right ICA. Mild tortuosity. RIGHT VERTEBRAL ARTERY: Antegrade flow with low resistance waveform. LEFT CAROTID ARTERY: No significant calcified disease of the left common carotid artery. Intermediate waveform maintained. Heterogeneous plaque at the left carotid bifurcation without significant calcifications. Low resistance waveform of the left ICA. LEFT VERTEBRAL ARTERY:  Antegrade flow with low resistance waveform. IMPRESSION: Color duplex indicates minimal heterogeneous plaque, with no hemodynamically significant stenosis by duplex criteria in the extracranial cerebrovascular circulation. Signed, Yvone Neu. Loreta Ave, DO [facility] Electronically Signed   By: Gilmer Mor D.O.   On: 06/20/2017 08:12   Dg Chest Port 1 View  Result Date: 06/21/2017 CLINICAL DATA:  Altered mental status EXAM: PORTABLE CHEST 1 VIEW COMPARISON:  11/20/2015 FINDINGS: Stable left pleural scarring. No consolidation. Stable cardiomediastinal silhouette with atherosclerosis. No pneumothorax. Surgical clips at the thoracic inlet. Streaky bibasilar scarring. IMPRESSION: No active disease.  Bibasilar scarring. Electronically Signed   By: Jasmine Pang M.D.   On: 06/21/2017 00:38   Ct Head Code Stroke Wo Contrast  Result Date: 06/19/2017 CLINICAL DATA:  Code stroke.  Left-sided weakness.  Facial droop. EXAM: CT HEAD WITHOUT CONTRAST TECHNIQUE: Contiguous axial images were obtained from the base of the skull through the vertex without intravenous contrast. COMPARISON:  Brain MRI 04/01/2011 FINDINGS: Brain: Small chronic infarcts are again seen in the cerebellum and right corona radiata. Periventricular white matter hypodensities bilaterally are nonspecific but compatible with mild chronic small vessel ischemic disease. Cerebral atrophy is not greater than expected for age. There is patchy low density  anteriorly in the right lentiform nucleus, which was normal on the  prior MRI and on an older CT from 06/07/2010. There may also be involvement of the anterior limb of the right internal capsule. There is no evidence of acute cortically based infarct, intracranial hemorrhage, mass, midline shift, or extra-axial fluid collection. Vascular: Calcified atherosclerosis at the skullbase. No hyperdense vessel. Skull: No fracture or focal osseous lesion. Sinuses/Orbits: Visualized paranasal sinuses and mastoid air cells are clear. Bilateral cataract extraction. Other: None. ASPECTS (Alberta Stroke Program Early CT Score) - Ganglionic level infarction (caudate, lentiform nuclei, internal capsule, insula, M1-M3 cortex): 5-6 (lentiform nucleus and possible internal capsule involvement) - Supraganglionic infarction (M4-M6 cortex): 3 Total score (0-10 with 10 being normal): 8-9 IMPRESSION: 1. Acute right basal ganglia infarct.  No hemorrhage. 2. ASPECTS is 8-9. 3. Mild chronic small vessel ischemic disease. These results were called by telephone at the time of interpretation on 06/19/2017 at 11:43 am to Dr. Minna Antis , who verbally acknowledged these results. Electronically Signed   By: Sebastian Ache M.D.   On: 06/19/2017 11:46    EKG: Orders placed or performed during the hospital encounter of 06/20/17  . ED EKG  . ED EKG  . EKG 12-Lead  . EKG 12-Lead    IMPRESSION AND PLAN: 83 year old elderly female patient with history of basal ganglia CVA, GERD, hyperlipidemia, hypertension, thyroid disease presented to the emergency room for confusion initially at the facility. Patient has low-grade fever and dysuria.  Admitting diagnosis 1. Disorientation secondary to UTI 2. Urinary tract infection 3. Basal ganglia CVA 4. Hyperlipidemia 5. Hypertension Treatment plan Admit patient to medical floor Start patient on IV Rocephin antiemetic 1 g daily Resume Plavix 75 MG orally daily Follow-up urine culture Continue statin medication  All the records are reviewed and case  discussed with ED provider. Management plans discussed with the patient, family and they are in agreement.  CODE STATUS:DNR Code Status History    Date Active Date Inactive Code Status Order ID Comments User Context   06/19/2017 16:06 06/20/2017 16:00 Full Code [id number]  Marguarite Arbour, MD Inpatient    Advance Directive Documentation     Most Recent Value  Type of Advance Directive  Healthcare Power of Attorney, Out of facility DNR (pink MOST or yellow form) [DNR form not valid- no effective date ]  Pre-existing out of facility DNR order (yellow form or pink MOST form)  No data  "MOST" Form in Place?  No data       TOTAL TIME TAKING CARE OF THIS PATIENT: 52 minutes.    Ihor Austin M.D on 06/21/2017 at 3:11 AM  Between 7am to 6pm - Pager - [phone]  After 6pm go to [email or web address] - password [email or web address] [facility]  [facility]  Office  [phone]  CC: Primary care physician; Living, [facility]

## 2017-06-21 NOTE — ED Notes (Signed)
Pt finished eating; HOB lowered a bit at pt request; lights out at pt request;

## 2017-06-21 NOTE — Progress Notes (Addendum)
Pt admitted to unit.  No family at bedside.  Pt was discharged from this unit yesterday and dtr was present the whole time.  Called dtr cell number to make sure she was aware [facility].  Left message with no answer. [facility] and they stated that the dtr was aware the patient was transferred to the hospital. Henriette CombsSarah Lalania Haseman RN [phone]  Received call back from dtr.  She is now aware that pt is here for UTI. Henriette CombsSarah Abena Erdman RN

## 2017-06-22 MED ORDER — TRAMADOL HCL 50 MG PO TABS
50.0000 mg | ORAL_TABLET | Freq: Four times a day (QID) | ORAL | Status: DC | PRN
  Administered 2017-06-22 – 2017-06-23 (×3): 50 mg via ORAL
  Filled 2017-06-22 (×4): qty 1

## 2017-06-22 MED ORDER — LEVOTHYROXINE SODIUM 75 MCG PO TABS
75.0000 ug | ORAL_TABLET | Freq: Every day | ORAL | Status: DC
  Administered 2017-06-23 – 2017-06-24 (×2): 75 ug via ORAL
  Filled 2017-06-22: qty 1
  Filled 2017-06-22 (×2): qty 3
  Filled 2017-06-22: qty 1

## 2017-06-22 NOTE — Clinical Social Work Placement (Signed)
   CLINICAL SOCIAL WORK PLACEMENT  NOTE  Date:  06/22/2017  Patient Details  Name: Alyssa HartiganDorothy R Tony MRN: [id number] Date of Birth: 01/08/1921  Clinical Social Work is seeking post-discharge placement for this patient at the Skilled  Nursing Facility level of care (*CSW will initial, date and re-position this form in  chart as items are completed):  Yes   Patient/family provided with [facility] Clinical Social Work Department's list of facilities offering this level of care within the geographic area requested by the patient (or if unable, by the patient's family).  Yes   Patient/family informed of their freedom to choose among providers that offer the needed level of care, that participate in Medicare, Medicaid or managed care program needed by the patient, have an available bed and are willing to accept the patient.  Yes   Patient/family informed of [facility]'s ownership interest in [facility] and [facility], as well as of the fact that they are under no obligation to receive care at these facilities.  PASRR submitted to EDS on 06/22/17     PASRR number received on 06/22/17     Existing PASRR number confirmed on       FL2 transmitted to all facilities in geographic area requested by pt/family on 06/22/17     FL2 transmitted to all facilities within larger geographic area on       Patient informed that his/her managed care company has contracts with or will negotiate with certain facilities, including the following:            Patient/family informed of bed offers received.  Patient chooses bed at       Physician recommends and patient chooses bed at      Patient to be transferred to   on  .  Patient to be transferred to facility by       Patient family notified on   of transfer.  Name of family member notified:        PHYSICIAN       Additional Comment:    _______________________________________________ Elianne Gubser, Darleen CrockerBailey M, LCSW 06/22/2017, 2:02 PM

## 2017-06-22 NOTE — Evaluation (Signed)
Physical Therapy Evaluation Patient Details Name: Alyssa HartiganDorothy R Force MRN: [id number] DOB: 09/18/1920 Today's Date: 06/22/2017   History of Present Illness  Pt is a 83 y/o F who was referred from ALF for confusion.  She was found to have UTI.  Pt just discharged from the hospital to facility 1 day PTA after being managed for a stroke.  Pt's PMH includes TKA, mild dementia.    Clinical Impression  Pt admitted with above diagnosis. Pt currently with functional limitations due to the deficits listed below (see PT Problem List). Ms. Conard NovakHinshaw presents with increased weakness since last evaluation just two days prior. She requires up to mod +2 assist for bed mobility and up to max assist to attempt sit>stand.  Sit>stand attempted 6x without ability to achieve upright standing due to fatigue.  Given pt's current mobility status, recommending SNF at d/c.  Pt will benefit from skilled PT to increase their independence and safety with mobility to allow discharge to the venue listed below.      Follow Up Recommendations SNF    Equipment Recommendations  None recommended by PT    Recommendations for Other Services       Precautions / Restrictions Precautions Precautions: Fall Restrictions Weight Bearing Restrictions: No      Mobility  Bed Mobility Overal bed mobility: Needs Assistance Bed Mobility: Supine to Sit;Sit to Supine     Supine to sit: Min guard;HOB elevated Sit to supine: Mod assist;+2 for physical assistance   General bed mobility comments: Pt requires max increased time and effort for supine>sit.  To return to supine she requires +2 assist to bring LEs into bed and to guide trunk.   Transfers Overall transfer level: Needs assistance Equipment used: Rolling walker (2 wheeled) Transfers: Sit to/from Stand Sit to Stand: Max assist;From elevated surface         General transfer comment: Attempted sit>stand x6 but pt was not able to achieve standing upright and braces legs  against side of bed for support.  Initially pt requires min assist to achieve almost standing and with subsequent attempts she requires increasingly more assist due to fatigue, despite seated rest breaks between trials.  Pt reports, "My knees are going to give out".  Bil knees remain in flexed position with flexed trunk, placing pt at an increased risk of falling.   Ambulation/Gait             General Gait Details: Unable to assess  Stairs            Wheelchair Mobility    Modified Rankin (Stroke Patients Only)       Balance Overall balance assessment: Needs assistance Sitting-balance support: No upper extremity supported;Feet supported Sitting balance-Leahy Scale: Fair     Standing balance support: Bilateral upper extremity supported;During functional activity Standing balance-Leahy Scale: Poor Standing balance comment: Pt requires up to max assist to achieve almost standing position with BUE support                             Pertinent Vitals/Pain Pain Assessment: 0-10 Pain Score: 4  Pain Location: upper back and L leg (from arthritis) Pain Descriptors / Indicators: Aching;Discomfort;Grimacing Pain Intervention(s): Limited activity within patient's tolerance;Monitored during session    Home Living Family/patient expects to be discharged to:: Assisted living               Home Equipment: Walker - 4 wheels      Prior  Function Level of Independence: Independent with assistive device(s)         Comments: No falls in the past 6 months.  At baseline amb[facility]  Ambulated 65 ft with walker and min guard during recent admission.  [facility]  Pt has an aide that provides supervision for showering.  Pt is independent with dressing.      Hand Dominance        Extremity/Trunk Assessment   Upper Extremity Assessment Upper Extremity Assessment: (BUE strength grossly  4/5)    Lower Extremity Assessment Lower Extremity Assessment: (RLE strength grossly 4/5, LLE strength grossly 4-/5)    Cervical / Trunk Assessment Cervical / Trunk Assessment: Kyphotic  Communication   Communication: No difficulties  Cognition Arousal/Alertness: Awake/alert Behavior During Therapy: WFL for tasks assessed/performed Overall Cognitive Status: History of cognitive impairments - at baseline                                 General Comments: Per chart review, pt with h/o baseline mild dementia.  Pt not oriented to place or situation.       General Comments General comments (skin integrity, edema, etc.): Noted pt wheezing with bed mobility, SpO2 reading 97% on RA. Daughter present during evaluation.    Exercises General Exercises - Upper Extremity Shoulder Flexion: AROM;Both;10 reps;Supine General Exercises - Lower Extremity Straight Leg Raises: Strengthening;Both;5 reps;Supine   Assessment/Plan    PT Assessment Patient needs continued PT services  PT Problem List Decreased strength;Decreased activity tolerance;Decreased balance;Decreased mobility;Decreased cognition;Decreased knowledge of use of DME;Decreased safety awareness;Pain       PT Treatment Interventions DME instruction;Gait training;Functional mobility training;Therapeutic activities;Therapeutic exercise;Balance training;Neuromuscular re-education;Cognitive remediation;Patient/family education;Wheelchair mobility training    PT Goals (Current goals can be found in the Care Plan section)  Acute Rehab PT Goals Patient Stated Goal: to walk PT Goal Formulation: With patient Time For Goal Achievement: 07/06/17 Potential to Achieve Goals: Good    Frequency Min 2X/week   Barriers to discharge        Co-evaluation               AM-PAC PT "6 Clicks" Daily Activity  Outcome Measure Difficulty turning over in bed (including adjusting bedclothes, sheets and blankets)?:  Unable Difficulty moving from lying on back to sitting on the side of the bed? : Unable Difficulty sitting down on and standing up from a chair with arms (e.g., wheelchair, bedside commode, etc,.)?: Unable Help needed moving to and from a bed to chair (including a wheelchair)?: Total Help needed walking in hospital room?: Total Help needed climbing 3-5 steps with a railing? : Total 6 Click Score: 6    End of Session Equipment Utilized During Treatment: Gait belt Activity Tolerance: Patient limited by fatigue Patient left: in bed;with call bell/phone within reach;with nursing/sitter in room;with family/visitor present(Nurse techn in room replacing external catheter) Nurse Communication: Mobility status PT Visit Diagnosis: Muscle weakness (generalized) (M62.81);Unsteadiness on feet (R26.81);Other abnormalities of gait and mobility (R26.89);Difficulty in walking, not elsewhere classified (R26.2)    Time: [id number] PT Time Calculation (min) (ACUTE ONLY): 35 min   Charges:   PT Evaluation $PT Eval Moderate Complexity: 1 Mod PT Treatments $Therapeutic Activity: 8-22 mins   PT G Codes:   PT G-Codes **NOT FOR INPATIENT CLASS** Functional Assessment Tool Used: AM-PAC 6 Clicks Basic Mobility;Clinical judgement Functional Limitation: Mobility: Walking and  moving around Mobility: Walking and Moving Around Current Status [phone]): 100 percent impaired, limited or restricted Mobility: Walking and Moving Around Goal Status [phone]): At least 60 percent but less than 80 percent impaired, limited or restricted    Encarnacion Chu PT, DPT 06/22/2017, 9:16 AM

## 2017-06-22 NOTE — Progress Notes (Signed)
[facility] [place] [facility]   PATIENT NAME: Alyssa King    MR#:  [id number]  DATE OF BIRTH:  07/07/1921  SUBJECTIVE:  CHIEF COMPLAINT:   Chief Complaint  Patient presents with  . Altered Mental Status     Recent d/c with the stroke- at NH was very drowsy and sent back. Found to have UTI. After IV fluids and Abx, more alert today. Still some confusion. Very weak, could not get out of bed without help.  REVIEW OF SYSTEMS:   She have some confusion and may have some baseline dementia.  ROS  DRUG ALLERGIES:  No Known Allergies  VITALS:  Blood pressure (!) 166/78, pulse 88, temperature 97.8 F (36.6 C), resp. rate 18, height 5\' 3"  (1.6 m), weight 83 kg (183 lb), SpO2 98 %.  PHYSICAL EXAMINATION:  GENERAL:  83 y.o.-year-old patient lying in the bed with no acute distress.  EYES: Pupils equal, round, reactive to light and accommodation. No scleral icterus. Extraocular muscles intact.  HEENT: Head atraumatic, normocephalic. Oropharynx and nasopharynx clear.  NECK:  Supple, no jugular venous distention. No thyroid enlargement, no tenderness.  LUNGS: Normal breath sounds bilaterally, no wheezing, rales,rhonchi or crepitation. No use of accessory muscles of respiration.  CARDIOVASCULAR: S1, S2 normal. No murmurs, rubs, or gallops.  ABDOMEN: Soft, nontender, nondistended. Bowel sounds present. No organomegaly or mass.  EXTREMITIES: No pedal edema, cyanosis, or clubbing.  NEUROLOGIC: Cranial nerves II through XII are intact. Muscle strength 3-4/5 in all extremities. Sensation intact. Gait not checked.  PSYCHIATRIC: The patient is alert and oriented x 1.  SKIN: No obvious rash, lesion, or ulcer.   Physical Exam LABORATORY PANEL:   CBC Recent Labs  Lab 06/21/17 0630  WBC 11.9*  HGB 13.9  HCT 39.5  PLT 274   ------------------------------------------------------------------------------------------------------------------  Chemistries  Recent Labs   Lab 06/20/17 2348 06/21/17 0630  NA 133* 134*  K 3.9 3.5  CL 101 102  CO2 21* 21*  GLUCOSE 129* 123*  BUN 17 18  CREATININE 0.85 0.78  CALCIUM 8.8* 8.2*  AST 16  --   ALT 11*  --   ALKPHOS 57  --   BILITOT 1.0  --    ------------------------------------------------------------------------------------------------------------------  Cardiac Enzymes Recent Labs  Lab 06/19/17 1111 06/20/17 2348  TROPONINI <0.03 <0.03   ------------------------------------------------------------------------------------------------------------------  RADIOLOGY:  Ct Head Wo Contrast  Result Date: 06/21/2017 CLINICAL DATA:  Patient was unresponsive tonight.  Recent stroke. EXAM: CT HEAD WITHOUT CONTRAST TECHNIQUE: Contiguous axial images were obtained from the base of the skull through the vertex without intravenous contrast. COMPARISON:  06/19/2017 FINDINGS: Brain: Progressing an enlarging area of low-attenuation in the right basal ganglia consistent with expected evolution of acute right basal ganglia infarct. There is mild associated effacement of the anterior horn of the right lateral ventricle. There is no developing acute hemorrhage. Diffuse cerebral atrophy. Patchy low-attenuation changes in the deep white matter consistent with small vessel ischemia. Mild ventricular dilatation consistent with central atrophy. No midline shift. Gray-white matter junctions are distinct. Basal cisterns are not effaced. No abnormal extra-axial fluid collections. Vascular: Internal carotid and vertebrobasilar artery calcifications are demonstrated. Skull: The calvarium appears intact. Sinuses/Orbits: Paranasal sinuses and mastoid air cells are clear. Other: None. IMPRESSION: Enlarging area of low-attenuation in the right basal ganglia consistent with expected evolution of acute right basal ganglia infarct since previous study. Mild effacement of the anterior horn of the right lateral ventricle. No acute intracranial  hemorrhage. Chronic atrophy and small  vessel ischemic changes. Electronically Signed   By: Burman NievesWilliam  Stevens M.D.   On: 06/21/2017 00:27   Dg Chest Port 1 View  Result Date: 06/21/2017 CLINICAL DATA:  Altered mental status EXAM: PORTABLE CHEST 1 VIEW COMPARISON:  11/20/2015 FINDINGS: Stable left pleural scarring. No consolidation. Stable cardiomediastinal silhouette with atherosclerosis. No pneumothorax. Surgical clips at the thoracic inlet. Streaky bibasilar scarring. IMPRESSION: No active disease.  Bibasilar scarring. Electronically Signed   By: Jasmine PangKim  Fujinaga M.D.   On: 06/21/2017 00:38    ASSESSMENT AND PLAN:   Active Problems:   Altered mental state   * Altered mental status- metabolic encephalopathy   UTI   IV rocephin, cx sent. Awaited results.   IV fluids, some improved.  * recent stroke   Atorvastatin, Plavix.   Need PT eval again with UTI now,.   Need rehab.  * Htn   Cont amlodipine.  * Hypothyroidism   Cont levothyroxine  * Dementia    * Arthritis pain in knee   Tramadol PRN.  All the records are reviewed and case discussed with Care Management/Social Workerr. Management plans discussed with the patient, family and they are in agreement.  CODE STATUS: DNR  TOTAL TIME TAKING CARE OF THIS PATIENT: 35 minutes.    POSSIBLE D/C IN 1-2 DAYS, DEPENDING ON CLINICAL CONDITION.   Altamese DillingVaibhavkumar Adelfa Lozito M.D on 06/22/2017   Between 7am to 6pm - Pager - [phone]  After 6pm go to [email or web address] - password [email or web address]  [facility] [place] [facility]  Office  [phone]  CC: Primary care physician; [facility]  Note: This dictation was prepared with Dragon dictation along with smaller phrase technology. Any transcriptional errors that result from this process are unintentional.

## 2017-06-22 NOTE — Progress Notes (Addendum)
Clinical Child psychotherapistocial Worker (CSW) presented bed offers to patient's daughter Linton Rumpmy and Jasmine DecemberSharon. [facility]. [facility] is aware of accepted bed offer. Plan is for [facility] to D[facility] pending medical clearance. MD aware of above. CSW will continue to follow and assist as needed.   [employer]aker Hughes IncorporatedBailey Kennede Lusk, LCSW [phone]

## 2017-06-22 NOTE — Progress Notes (Signed)
Clinical Social Worker (CSW) met with patient's daughter Ivin Booty at bedside and made her aware that PT is recommending SNF. CSW explained SNF process and that medicare requires a 3 night qualifying inpatient stay in a hospital in order to pay for SNF. Patient was admitted to inpatient on 06/21/17. Daughter verbalized her understanding and is agreeable to SNF search in [place]. Daughter prefers [facility] or [facility] FL2 complete and faxed out. CSW will continue to follow and assist as needed.   [employer], LCSW [phone]

## 2017-06-23 ENCOUNTER — Inpatient Hospital Stay: Payer: Medicare Other

## 2017-06-23 LAB — BASIC METABOLIC PANEL
ANION GAP: 11 (ref 5–15)
BUN: 18 mg/dL (ref 6–20)
CALCIUM: 8 mg/dL — AB (ref 8.9–10.3)
CO2: 23 mmol/L (ref 22–32)
CREATININE: 0.57 mg/dL (ref 0.44–1.00)
Chloride: 97 mmol/L — ABNORMAL LOW (ref 101–111)
GFR calc Af Amer: >60 mL/min (ref >60)
GLUCOSE: 156 mg/dL — AB (ref 65–99)
Potassium: 3 mmol/L — ABNORMAL LOW (ref 3.5–5.1)
Sodium: 131 mmol/L — ABNORMAL LOW (ref 135–145)

## 2017-06-23 LAB — CBC
HCT: 38 % (ref 35.0–47.0)
HEMOGLOBIN: 13 g/dL (ref 12.0–16.0)
MCH: 29.8 pg (ref 26.0–34.0)
MCHC: 34.1 g/dL (ref 32.0–36.0)
MCV: 87.4 fL (ref 80.0–100.0)
PLATELETS: 267 10*3/uL (ref 150–440)
RBC: 4.35 MIL/uL (ref 3.80–5.20)
RDW: 13.2 % (ref 11.5–14.5)
WBC: 12.6 10*3/uL — ABNORMAL HIGH (ref 3.6–11.0)

## 2017-06-23 MED ORDER — KETOROLAC TROMETHAMINE 15 MG/ML IJ SOLN
15.0000 mg | Freq: Four times a day (QID) | INTRAMUSCULAR | Status: DC | PRN
  Administered 2017-06-23: 15 mg via INTRAVENOUS
  Filled 2017-06-23 (×3): qty 1

## 2017-06-23 MED ORDER — KCL IN DEXTROSE-NACL 40-5-0.9 MEQ/L-%-% IV SOLN
INTRAVENOUS | Status: DC
  Administered 2017-06-23: 13:00:00 via INTRAVENOUS
  Filled 2017-06-23 (×2): qty 1000

## 2017-06-23 NOTE — Progress Notes (Signed)
Patient's left leg is now contracted up and patient continues to c/o pain in her leg.  Dr. Elisabeth PigeonVachhani made aware and will place a neuro consult.  New order for Toradol.  Orson Apeanielle Anderson [place], RN

## 2017-06-23 NOTE — Progress Notes (Signed)
[facility] - [place] [facility]   PATIENT NAME: Alyssa King    MR#:  [id number]  DATE OF BIRTH:  10/17/1920  SUBJECTIVE:  CHIEF COMPLAINT:   Chief Complaint  Patient presents with  . Altered Mental Status     Recent d/c with the stroke- at NH was very drowsy and sent back. Found to have UTI. After IV fluids and Abx, was more alert yesterday,but started on tramadol for pain, and more lethargic today.  REVIEW OF SYSTEMS:   She have some confusion and may have some baseline dementia.  ROS  DRUG ALLERGIES:  No Known Allergies  VITALS:  Blood pressure (!) 129/57, pulse 81, temperature 99.5 F (37.5 C), temperature source Oral, resp. rate 18, height 5\' 3"  (1.6 m), weight 83 kg (183 lb), SpO2 92 %.  PHYSICAL EXAMINATION:  GENERAL:  83 y.o.-year-old patient lying in the bed with no acute distress.  EYES: Pupils equal, round, reactive to light and accommodation. No scleral icterus. Extraocular muscles intact.  HEENT: Head atraumatic, normocephalic. Oropharynx and nasopharynx clear.  NECK:  Supple, no jugular venous distention. No thyroid enlargement, no tenderness.  LUNGS: Normal breath sounds bilaterally, no wheezing, rales,rhonchi or crepitation. No use of accessory muscles of respiration.  CARDIOVASCULAR: S1, S2 normal. No murmurs, rubs, or gallops.  ABDOMEN: Soft, nontender, nondistended. Bowel sounds present. No organomegaly or mass.  EXTREMITIES: No pedal edema, cyanosis, or clubbing.  NEUROLOGIC: Cranial nerves not checked. Muscle strength 3-4/5 in all extremities. Sensation intact. Gait not checked.  PSYCHIATRIC: The patient is drowsy.  SKIN: No obvious rash, lesion, or ulcer.   Physical Exam LABORATORY PANEL:   CBC Recent Labs  Lab 06/23/17 1033  WBC 12.6*  HGB 13.0  HCT 38.0  PLT 267   ------------------------------------------------------------------------------------------------------------------  Chemistries  Recent Labs  Lab  06/20/17 2348  06/23/17 1033  NA 133*   < > 131*  K 3.9   < > 3.0*  CL 101   < > 97*  CO2 21*   < > 23  GLUCOSE 129*   < > 156*  BUN 17   < > 18  CREATININE 0.85   < > 0.57  CALCIUM 8.8*   < > 8.0*  AST 16  --   --   ALT 11*  --   --   ALKPHOS 57  --   --   BILITOT 1.0  --   --    < > = values in this interval not displayed.   ------------------------------------------------------------------------------------------------------------------  Cardiac Enzymes Recent Labs  Lab 06/19/17 1111 06/20/17 2348  TROPONINI <0.03 <0.03   ------------------------------------------------------------------------------------------------------------------  RADIOLOGY:  Ct Head Wo Contrast  Result Date: 06/23/2017 CLINICAL DATA:  Altered level of consciousness EXAM: CT HEAD WITHOUT CONTRAST TECHNIQUE: Contiguous axial images were obtained from the base of the skull through the vertex without intravenous contrast. COMPARISON:  06/21/2017 FINDINGS: Brain: Large infarct noted in the right basal ganglia compatible with acute to subacute infarct, similar to prior study. No hemorrhage. No hydrocephalus or midline shift. There is atrophy and chronic small vessel disease changes. Vascular: No hyperdense vessel or unexpected calcification. Skull: No acute calvarial abnormality. Sinuses/Orbits: Visualized paranasal sinuses and mastoids clear. Orbital soft tissues unremarkable. Other: None IMPRESSION: Right basal ganglia infarct again noted, unchanged since prior study. No hemorrhage or new infarct. Atrophy, chronic small vessel disease. Electronically Signed   By: Charlett NoseKevin  Dover M.D.   On: 06/23/2017 11:09   Dg Hand 2 View Left  Result Date:  06/23/2017 CLINICAL DATA:  Left hand pain. EXAM: LEFT HAND - 2 VIEW COMPARISON:  None. FINDINGS: There is no evidence of fracture or dislocation. Joint space narrowing with "gull-wing" appearance of the distal interphalangeal joints of the second, third, and fourth digits.  Mild joint space narrowing and cystic change at the thumb carpal metacarpal joint. No osseous erosions. Chondrocalcinosis of the triangular fibrocartilage. Soft tissue edema is noted about the radial aspect of the hand. IMPRESSION: 1. No evidence of acute abnormality. 2. Scattered degenerative change. 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 3. Chondrocalcinosis of the triangular fibrocartilage. Electronically Signed   By: Rubye OaksMelanie  Ehinger M.D.   On: 06/23/2017 06:12    ASSESSMENT AND PLAN:   Active Problems:   Altered mental state   * Altered mental status- metabolic encephalopathy   UTI   IV rocephin, cx sent. Awaited results.   IV fluids,   Lethargic today, so repeated CT Head and labs- no new findings.   Likely due to tramadol, stopped it.   Watch it.  * recent stroke   Atorvastatin, Plavix.   PT eval again with UTI now.   Need rehab.  * Htn   Cont amlodipine.  * Hypothyroidism   Cont levothyroxine  * Dementia    * Arthritis pain in knee   Tramadol PRN.  All the records are reviewed and case discussed with Care Management/Social Workerr. Management plans discussed with the patient, family and they are in agreement.  CODE STATUS: DNR  TOTAL TIME TAKING CARE OF THIS PATIENT: 35 minutes.    POSSIBLE D/C IN 1-2 DAYS, DEPENDING ON CLINICAL CONDITION.   Altamese DillingVaibhavkumar Zakery Normington M.D on 06/23/2017   Between 7am to 6pm - Pager - [phone]  After 6pm go to [email or web address] - password [email or web address]  [facility] [place] [facility]  Office  [phone]  CC: Primary care physician; Living[facility]  Note: This dictation was prepared with Dragon dictation along with smaller phrase technology. Any transcriptional errors that result from this process are unintentional.

## 2017-06-23 NOTE — Progress Notes (Signed)
Pt cried out in pain when I attempted to wash her left hand.  Pt has a new ecchymosis to the top of her left hand from phebotomy but is unable to lift arm because of left hand and wrist pain.  Spoke with Dr Tobi BastosPyreddy and he will order xry for hand and wrist. Henriette CombsSarah Romon Devereux RN

## 2017-06-23 NOTE — Progress Notes (Signed)
PT Cancellation Patient Details Name: Alyssa HartiganDorothy R King MRN: [id number] DOB: 05/14/1921 Today's Date: 06/23/2017    History of Present Illness Pt is a 83 y/o F who was referred from ALF for confusion.  She was found to have UTI.  Pt just discharged from the hospital to facility 1 day PTA after being managed for a stroke.  Pt's PMH includes TKA, mild dementia.    PT Comments    PT deferred treatment due to pt lethargy/non-responsiveness and new onset of L LE being contracted in a flexed position.  PT attempted testing for spasticity and was unable to initiate movement.  Pt reported pain in L UE and LE.  PT and R.N. Repositioned pt for comfort.  R.N. Communicated to PT that pt has presented this was since this morning and has demonstrated a steady decline.  Pt received a CT earlier today.  Will re-evaluate when results are received.                                                                                                                                                                                                                            Glenetta HewSarah Lynniah Janoski, PT, DPT   Glenetta HewSarah Brycelyn Gambino 06/23/2017, 3:02 PM

## 2017-06-23 NOTE — Care Management Important Message (Signed)
Important Message  Patient Details  Name: Alyssa King MRN: [id number] Date of Birth: 10/12/1920   Medicare Important Message Given:  Yes    [name], RN 06/23/2017, 7:08 AM

## 2017-06-23 NOTE — Evaluation (Addendum)
Clinical/Bedside Swallow Evaluation Patient Details  Name: Alyssa King MRN: [id number] Date of Birth: 12/18/1920  Today's Date: 06/23/2017 Time: SLP Start Time (ACUTE ONLY): 1255 SLP Stop Time (ACUTE ONLY): 1355 SLP Time Calculation (min) (ACUTE ONLY): 60 min  Past Medical History:  Past Medical History:  Diagnosis Date  . Arthritis   . Cataract   . Depression   . GERD (gastroesophageal reflux disease)   . Hyperlipidemia   . Hypertension   . Psoriasis   . Stroke (HCC)   . Thyroid disease    Past Surgical History:  Past Surgical History:  Procedure Laterality Date  . REPLACEMENT TOTAL KNEE     HPI:  Pt is a 83 y.o. female with a known history of arthritis, GERD, hyperlipidemia, hypertension, CVA basal ganglia, thyroid disease was referred from the facility for confusion. Patient slumped over in the recliner and was confused. Patient was evaluated in the emergency room was found to have urinary tract infection. She was worked up with a CT head which showed the old basal ganglia CVA but no acute abnormality. Patient was recently discharged 1 day ago to the facility from our hospital after being managed for stroke. Has some low-grade fever and dysuria. No complaints of any chest pain, shortness of breath. Hospitalist service was consulted for further care.   Assessment / Plan / Recommendation Clinical Impression  Pt appears to present w/ mild+ oral phase dysphagia w/ increased risk for aspiration. Oral phase swallow deficits can impact the pharyngeal phase swallow function. Pt has past med hx of arthritis, GERD, hyperlipidemia, hypertension, CVA basal ganglia, and thyroid disease.   Pt was admitted to ED for confusion. Pt was recently given pain med per NSG report and appeared slightly drowsy towards end of evaluation. Unsure of pt's Cognitive baseline. Cognitive decline can increase pt's aspiration/choking risk.  Pt was given trials of thin liquids via straw and puree. During  trials of thin liquid via straw, pt's oral phase appeared grossly [facility] c/b adequate labial seal around straw, timely A-P transit and swallow, and adeqaute oral clearing. No immediate, overt s/s of aspiration were noted given trials of thin liquids. No decline in vocal quality or respiratory status was noted. During trials of puree, pt exhibited prolonged A-P transit and mod oral holding. Given time and min verbal cues, pt swallowed bolus. Pt exhibited adequate oral clearing given trials of puree. No immediate, overt s/s of aspiration were noted. No decline in vocal quality or respiratory status were noted. Pt reported that she was in pain and ST alerted NSG. Pt was drowsy - could be d/t recent pain med intake. ST educated NSG tech on aspiration precautions of sitting upright, making sure pt is alert/awake for meals, small bites/sips, slow pace, and reducing environmental distractions. Pt required max assist for feeding - ST started w/ hand over hand but ended w/ total assist of feeding pt.  Recommend Dysphagia level 1 diet w/ thin liquids d/t current Cognitive status, conservation of energy, and presentation during evaluation (oral holding). Recommend aspiration precautions - sitting upright, making sure pt is alert/awake for meals, small bites/sips, slow pace, and reducing environmental distractions. Recommend meds whole in puree as able. Recommend dietician f/u. Recommend total assist for feeding.   ST services will f/u w/ pt status, education, and diet toleration w/ trials to upgrade if appropriate while admitted. NSG/MD updated.  SLP Visit Diagnosis: Dysphagia, oropharyngeal phase (R13.12);Dysphagia, oral phase (R13.11)    Aspiration Risk  Mild aspiration risk  Diet Recommendation   Dysphagia level 1 w/ thin liquids; aspiration precautions.   Medication Administration: Whole meds with puree    Other  Recommendations Recommended Consults: (Dietician f/u) Oral Care Recommendations: Oral care  BID;Staff/trained caregiver to provide oral care   Follow up Recommendations Skilled Nursing facility      Frequency and Duration min 2x/week  1 week       Prognosis Prognosis for Safe Diet Advancement: Fair(-Good)      Swallow Study   General Date of Onset: 06/20/17 HPI: Pt is a 83 y.o. female with a known history of arthritis, GERD, hyperlipidemia, hypertension, CVA basal ganglia, thyroid disease was referred from the facility for confusion. Patient slumped over in the recliner and was confused. Patient was evaluated in the emergency room was found to have urinary tract infection. She was worked up with a CT head which showed the old basal ganglia CVA but no acute abnormality. Patient was recently discharged 1 day ago to the facility from our hospital after being managed for stroke. Has some low-grade fever and dysuria. No complaints of any chest pain, shortness of breath. Hospitalist service was consulted for further care. Type of Study: Bedside Swallow Evaluation Previous Swallow Assessment: none given Diet Prior to this Study: Regular;Thin liquids Temperature Spikes Noted: No(wbc elevated) Respiratory Status: Room air History of Recent Intubation: No Behavior/Cognition: Cooperative;Pleasant mood;Confused;Lethargic/Drowsy;Distractible;Requires cueing(pt just received pain meds per NSG) Oral Cavity Assessment: Within Functional Limits Oral Care Completed by SLP: Recent completion by staff Oral Cavity - Dentition: Adequate natural dentition;Poor condition;Missing dentition Vision: Functional for self-feeding Self-Feeding Abilities: Total assist Patient Positioning: Upright in bed Baseline Vocal Quality: Normal Volitional Cough: Strong Volitional Swallow: Able to elicit    Oral/Motor/Sensory Function Overall Oral Motor/Sensory Function: Within functional limits   Ice Chips Ice chips: Not tested   Thin Liquid Thin Liquid: Within functional limits Presentation: Straw Other  Comments: 6 trials    Nectar Thick Nectar Thick Liquid: Not tested   Honey Thick Honey Thick Liquid: Not tested   Puree Puree: Impaired(5 trials) Presentation: Spoon Oral Phase Impairments: Impaired mastication;Poor awareness of bolus Oral Phase Functional Implications: Oral holding;Prolonged oral transit Pharyngeal Phase Impairments: (none noted)   Solid   GO   Solid: Not tested       Alyssa King, SLP-Graduate Student Alyssa King 06/23/2017,1:49 PM    The information in this patient note, response to treatment, and overall treatment plan developed has been reviewed and agreed upon by this clinician.  Alyssa SomKatherine Watson, MS, CCC-SLP [phone] 06/24/17,4:45 PM

## 2017-06-24 ENCOUNTER — Inpatient Hospital Stay: Payer: Medicare Other

## 2017-06-24 LAB — CBC
HEMATOCRIT: 38 % (ref 35.0–47.0)
HEMOGLOBIN: 12.8 g/dL (ref 12.0–16.0)
MCH: 29.3 pg (ref 26.0–34.0)
MCHC: 33.6 g/dL (ref 32.0–36.0)
MCV: 87.2 fL (ref 80.0–100.0)
Platelets: 291 10*3/uL (ref 150–440)
RBC: 4.36 MIL/uL (ref 3.80–5.20)
RDW: 12.9 % (ref 11.5–14.5)
WBC: 12.1 10*3/uL — ABNORMAL HIGH (ref 3.6–11.0)

## 2017-06-24 LAB — BASIC METABOLIC PANEL
ANION GAP: 8 (ref 5–15)
BUN: 21 mg/dL — ABNORMAL HIGH (ref 6–20)
CHLORIDE: 102 mmol/L (ref 101–111)
CO2: 22 mmol/L (ref 22–32)
Calcium: 8.1 mg/dL — ABNORMAL LOW (ref 8.9–10.3)
Creatinine, Ser: 0.62 mg/dL (ref 0.44–1.00)
GFR calc Af Amer: >60 mL/min (ref >60)
GLUCOSE: 134 mg/dL — AB (ref 65–99)
POTASSIUM: 3.5 mmol/L (ref 3.5–5.1)
Sodium: 132 mmol/L — ABNORMAL LOW (ref 135–145)

## 2017-06-24 LAB — URINE CULTURE: Culture: >=100000 — AB

## 2017-06-24 MED ORDER — CLINDAMYCIN HCL 150 MG PO CAPS
300.0000 mg | ORAL_CAPSULE | Freq: Three times a day (TID) | ORAL | Status: DC
  Filled 2017-06-24 (×2): qty 2

## 2017-06-24 MED ORDER — CLINDAMYCIN HCL 300 MG PO CAPS: 300.0000 mg | ORAL_CAPSULE | Freq: Three times a day (TID) | ORAL | 0 refills | Status: AC

## 2017-06-24 NOTE — [place] FL2 (Signed)
[place] MEDICAID FL2 LEVEL OF CARE SCREENING TOOL     IDENTIFICATION  Patient Name: Alyssa King Birthdate: 11/27/1920 Sex: female Admission Date (Current Location): 06/20/2017  [place]   Facility and Address:  [facility], [address], [place], [place] [address]      Provider Number: [id number]  Attending Physician Name and Address:  Shaune Pollackhen, Qing, MD  Relative Name and Phone Number:       Current Level of Care: Hospital Recommended Level of Care: Skilled Nursing Facility  Prior Approval Number:    Date Approved/Denied:   PASRR Number: [id number]   Discharge Plan: Skilled Nursing Facility     Current Diagnoses: Patient Active Problem List   Diagnosis Date Noted  . Altered mental state 06/21/2017  . CVA (cerebral vascular accident) (HCC) 06/19/2017  . HTN, goal below 140/80 06/19/2017  . Gait disturbance 06/19/2017  . HLD (hyperlipidemia) 06/19/2017    Orientation RESPIRATION BLADDER Height & Weight     Self  Normal Continent Weight: 183 lb (83 kg) Height:  5\' 3"  (160 cm)  BEHAVIORAL SYMPTOMS/MOOD NEUROLOGICAL BOWEL NUTRITION STATUS      Continent Diet(Diet: 2 Grams Sodium. )  AMBULATORY STATUS COMMUNICATION OF NEEDS Skin   Extensive Assistance Verbally Normal                       Personal Care Assistance Level of Assistance  Bathing, Feeding, Dressing Bathing Assistance: Limited assistance Feeding assistance: Independent Dressing Assistance: Limited assistance     Functional Limitations Info  Sight, Hearing, Speech Sight Info: Adequate Hearing Info: Adequate Speech Info: Adequate    SPECIAL CARE FACTORS FREQUENCY  PT (By licensed PT) and OT      PT and OT 5 days per week              Contractures      Additional Factors Info  Code Status, Allergies Code Status Info: (DNR ) Allergies Info: (No Known Allergies. )           Current Medications (06/24/2017):  This  is the current hospital active medication list Current Facility-Administered Medications  Medication Dose Route Frequency Provider Last Rate Last Dose  . acetaminophen (TYLENOL) tablet 650 mg  650 mg Oral Q6H PRN Ihor AustinPyreddy, Pavan, MD   650 mg at 06/22/17 1823   Or  . acetaminophen (TYLENOL) suppository 650 mg  650 mg Rectal Q6H PRN Ihor AustinPyreddy, Pavan, MD      . amLODipine (NORVASC) tablet 5 mg  5 mg Oral Daily Pyreddy, Vivien RotaPavan, MD   5 mg at 06/24/17 0947  . atorvastatin (LIPITOR) tablet 10 mg  10 mg Oral q1800 Ihor AustinPyreddy, Pavan, MD   10 mg at 06/22/17 1815  . benazepril (LOTENSIN) tablet 10 mg  10 mg Oral Daily Pyreddy, Vivien RotaPavan, MD   10 mg at 06/24/17 0947  . cholecalciferol (VITAMIN D) tablet 2,000 Units  2,000 Units Oral Daily Ihor AustinPyreddy, Pavan, MD   2,000 Units at 06/24/17 0947  . clindamycin (CLEOCIN) capsule 300 mg  300 mg Oral Q8H Shaune Pollackhen, Qing, MD      . clopidogrel (PLAVIX) tablet 75 mg  75 mg Oral Daily Ihor AustinPyreddy, Pavan, MD   75 mg at 06/24/17 0947  . dextrose 5 % and 0.9 % NaCl with KCl 40 mEq/L infusion   Intravenous Continuous Altamese DillingVachhani, Vaibhavkumar, MD 50 mL/hr at 06/23/17 1245    . diclofenac sodium (VOLTAREN) 1 % transdermal gel 2 g  2  g Topical BID Altamese DillingVachhani, Vaibhavkumar, MD   2 g at 06/23/17 2250  . enoxaparin (LOVENOX) injection 40 mg  40 mg Subcutaneous Q24H Ihor AustinPyreddy, Pavan, MD   40 mg at 06/24/17 0656  . escitalopram (LEXAPRO) tablet 10 mg  10 mg Oral Daily Pyreddy, Vivien RotaPavan, MD   10 mg at 06/24/17 0947  . famotidine (PEPCID) tablet 20 mg  20 mg Oral Daily Pyreddy, Vivien RotaPavan, MD   20 mg at 06/24/17 0947  . ketorolac (TORADOL) 15 MG/ML injection 15 mg  15 mg Intravenous Q6H PRN Altamese DillingVachhani, Vaibhavkumar, MD   15 mg at 06/23/17 1558  . levothyroxine (SYNTHROID, LEVOTHROID) tablet 75 mcg  75 mcg Oral QAC breakfast Altamese DillingVachhani, Vaibhavkumar, MD   75 mcg at 06/24/17 0948  . omega-3 acid ethyl esters (LOVAZA) capsule 1 g  1 g Oral Daily Pyreddy, Vivien RotaPavan, MD   1 g at 06/24/17 0947  . ondansetron (ZOFRAN) tablet 4 mg  4  mg Oral Q6H PRN Pyreddy, Vivien RotaPavan, MD       Or  . ondansetron (ZOFRAN) injection 4 mg  4 mg Intravenous Q6H PRN Pyreddy, Pavan, MD      . senna-docusate (Senokot-S) tablet 1 tablet  1 tablet Oral QHS PRN Pyreddy, Vivien RotaPavan, MD      . vitamin B-12 (CYANOCOBALAMIN) tablet 1,000 mcg  1,000 mcg Oral Q M,W,F Ihor AustinPyreddy, Pavan, MD   1,000 mcg at 06/23/17 0919     Discharge Medications: Please see discharge summary for a list of discharge medications.  Relevant Imaging Results:  Relevant Lab Results:   Additional Information (SSN: [id number])  Ahnna Dungan, Darleen CrockerBailey M, LCSW

## 2017-06-24 NOTE — Progress Notes (Signed)
Physical Therapy Treatment Patient Details Name: Alyssa King MRN: [id number] DOB: 03/14/1921 Today's Date: 06/24/2017    History of Present Illness Pt is a 83 y/o F who was referred from ALF for confusion.  She was found to have UTI.  Pt just discharged from the hospital to facility 1 day PTA after being managed for a stroke.  Pt's PMH includes TKA, mild dementia.    PT Comments    Pt in bed.  Family stated she is more awake today.  Pt asking to get out of bed, trying to roll to right side in attempts to sit.  She is c/o pain from laying down too much and wanting to get up.  Pt continues to have RLE ROM limitations due to knee pain and holds knee flexed and is unable to extend fully despite ROM/stretching attempts.  She continues to request to get up to chair.  Lift system was used for pt and staff safety to transfer pt to recliner.  Pt reported she felt better in the recliner and was encouraged to stay up and ring for nursing when she was ready to return to bed.  Family in for instructions.  She will require a lift to return to bed.  Nurse tech aware and assisted with transfer.   Follow Up Recommendations  SNF     Equipment Recommendations       Recommendations for Other Services       Precautions / Restrictions Precautions Precautions: Fall Restrictions Weight Bearing Restrictions: No    Mobility  Bed Mobility Overal bed mobility: Needs Assistance Bed Mobility: Rolling Rolling: +2 for physical assistance;Max assist         General bed mobility comments: max assist for rolling left and right  Transfers Overall transfer level: Needs assistance               General transfer comment: Lift used to get pt out of bed to recliner per her request.  Unable to stand due to left knee pain  Ambulation/Gait                 Stairs            Wheelchair Mobility    Modified Rankin (Stroke Patients Only)       Balance                                             Cognition Arousal/Alertness: Awake/alert Behavior During Therapy: WFL for tasks assessed/performed                                          Exercises Other Exercises Other Exercises: ROM for right UE/LE for comfort and to promote knee extension on left.  Pt holds and is unable to extend knee due to pain.  She keeps LE flexed at all rimes greater thand 90 degrees.    General Comments        Pertinent Vitals/Pain Pain Assessment: Faces Faces Pain Scale: Hurts whole lot Pain Location: Left  knee and hand Pain Descriptors / Indicators: Grimacing;Discomfort Pain Intervention(s): Limited activity within patient's tolerance;Monitored during session    Home Living  Prior Function            PT Goals (current goals can now be found in the care plan section)      Frequency    Min 2X/week      PT Plan Current plan remains appropriate    Co-evaluation              AM-PAC PT "6 Clicks" Daily Activity  Outcome Measure  Difficulty turning over in bed (including adjusting bedclothes, sheets and blankets)?: Unable Difficulty moving from lying on back to sitting on the side of the bed? : Unable Difficulty sitting down on and standing up from a chair with arms (e.g., wheelchair, bedside commode, etc,.)?: Unable Help needed moving to and from a bed to chair (including a wheelchair)?: Total Help needed walking in hospital room?: Total Help needed climbing 3-5 steps with a railing? : Total 6 Click Score: 6    End of Session Equipment Utilized During Treatment: Other (comment) Activity Tolerance: Patient tolerated treatment well;Patient limited by pain Patient left: in chair;with chair alarm set;with call bell/phone within reach;with family/visitor present Nurse Communication: Mobility status;Need for lift equipment       Time: [phone] PT Time Calculation (min) (ACUTE ONLY): 31  min  Charges:  $Therapeutic Exercise: 8-22 mins $Therapeutic Activity: 8-22 mins                    G Codes:      Danielle DessSarah Londynn Sonoda, PTA 06/24/17, 9:37 AM

## 2017-06-24 NOTE — Progress Notes (Signed)
Patient discharged to [facility] via EMS. Report called to Brayton CavesJessie, Charity fundraiserN. Family aware of transfer. DNR and Prescriptions included in patient packet. IV removed without complications.

## 2017-06-24 NOTE — Discharge Summary (Signed)
[facility] - [place] at [facility]   PATIENT NAME: Alyssa King    MR#:  [id number]  DATE OF BIRTH:  07/14/1921  DATE OF ADMISSION:  06/20/2017   ADMITTING PHYSICIAN: Ihor AustinPavan Pyreddy, MD  DATE OF DISCHARGE: 06/24/2017  PRIMARY CARE PHYSICIAN: Living, Diamantina MonksBlakey Hall Assisted   ADMISSION DIAGNOSIS:  Lower urinary tract infectious disease [N39.0] Delirium [R41.0] Altered mental status, unspecified altered mental status type [R41.82] DISCHARGE DIAGNOSIS:  Active Problems:   Altered mental state  SECONDARY DIAGNOSIS:   Past Medical History:  Diagnosis Date  . Arthritis   . Cataract   . Depression   . GERD (gastroesophageal reflux disease)   . Hyperlipidemia   . Hypertension   . Psoriasis   . Stroke (HCC)   . Thyroid disease    HOSPITAL COURSE:    * Altered mental status- metabolic encephalopathy  due to  UTI   on IV rocephin, cx sent: STAPHYLOCOCCUS AUREUS and AEROCOCCUS VIRIDANS. Change to po clindamycin for 5 days.   Lethargic today, so repeated CT Head and labs- no new findings.   Likely due to tramadol, stopped it.  * recent stroke   Atorvastatin, Plavix.   PT eval: SNF.  * Htn   Cont amlodipine.  * Hypothyroidism   Cont levothyroxine  * Dementia, fall and aspiration precaution.    * Arthritis pain in knee   holdTramadol PRN due to drowsiness.  DISCHARGE CONDITIONS:  Stable, discharge to SNF today. CONSULTS OBTAINED:   DRUG ALLERGIES:  No Known Allergies DISCHARGE MEDICATIONS:   Allergies as of 06/24/2017   No Known Allergies     Medication List    TAKE these medications   acetaminophen 325 MG tablet Commonly known as:  TYLENOL Take 650 mg by mouth 2 (two) times daily. Pt is also able to take twice daily as needed for pain in between scheduled doses.   amLODipine 5 MG tablet Commonly known as:  NORVASC Take 5 mg by mouth daily.   atorvastatin 10 MG tablet Commonly known as:  LIPITOR Take 1 tablet (10 mg total)  by mouth daily at 6 PM.   benazepril 10 MG tablet Commonly known as:  LOTENSIN Take 10 mg by mouth daily.   cholecalciferol 1000 units tablet Commonly known as:  VITAMIN D Take 2,000 Units by mouth daily.   clindamycin 300 MG capsule Commonly known as:  CLEOCIN Take 1 capsule (300 mg total) by mouth every 8 (eight) hours.   clopidogrel 75 MG tablet Commonly known as:  PLAVIX Take 1 tablet (75 mg total) by mouth daily.   diclofenac sodium 1 % Gel Commonly known as:  VOLTAREN Apply 2 g topically 2 (two) times daily. Pt applies to both legs.   escitalopram 10 MG tablet Commonly known as:  LEXAPRO Take 10 mg by mouth daily.   Fish Oil 1000 MG Caps Take 1,000 mg by mouth daily.   levothyroxine 75 MCG tablet Commonly known as:  SYNTHROID, LEVOTHROID Take 75 mcg by mouth daily before breakfast.   ranitidine 150 MG tablet Commonly known as:  ZANTAC Take 150 mg by mouth at bedtime.   triamterene-hydrochlorothiazide 37.5-25 MG tablet Commonly known as:  MAXZIDE-25 Take 0.5 tablets by mouth daily.   vitamin B-12 1000 MCG tablet Commonly known as:  CYANOCOBALAMIN Take 1,000 mcg by mouth every Monday, Wednesday, and Friday.        DISCHARGE INSTRUCTIONS:  See AVS.   If you experience worsening of your admission symptoms, develop shortness of  breath, life threatening emergency, suicidal or homicidal thoughts you must seek medical attention immediately by calling 911 or calling your MD immediately  if symptoms less severe.  You Must read complete instructions/literature along with all the possible adverse reactions/side effects for all the Medicines you take and that have been prescribed to you. Take any new Medicines after you have completely understood and accpet all the possible adverse reactions/side effects.   Please note  You were cared for by a hospitalist during your hospital stay. If you have any questions about your discharge medications or the care you received  while you were in the hospital after you are discharged, you can call the unit and asked to speak with the hospitalist on call if the hospitalist that took care of you is not available. Once you are discharged, your primary care physician will handle any further medical issues. Please note that NO REFILLS for any discharge medications will be authorized once you are discharged, as it is imperative that you return to your primary care physician (or establish a relationship with a primary care physician if you do not have one) for your aftercare needs so that they can reassess your need for medications and monitor your lab values.    On the day of Discharge:  VITAL SIGNS:  Blood pressure (!) 158/78, pulse 85, temperature 99.5 F (37.5 C), temperature source Oral, resp. rate 20, height 5\' 3"  (1.6 m), weight 183 lb (83 kg), SpO2 96 %. PHYSICAL EXAMINATION:  GENERAL:  83 y.o.-year-old patient lying in the bed with no acute distress.  EYES: Pupils equal, round, reactive to light and accommodation. No scleral icterus. Extraocular muscles intact.  HEENT: Head atraumatic, normocephalic. Oropharynx and nasopharynx clear.  NECK:  Supple, no jugular venous distention. No thyroid enlargement, no tenderness.  LUNGS: Normal breath sounds bilaterally, no wheezing, rales,rhonchi or crepitation. No use of accessory muscles of respiration.  CARDIOVASCULAR: S1, S2 normal. No murmurs, rubs, or gallops.  ABDOMEN: Soft, non-tender, non-distended. Bowel sounds present. No organomegaly or mass.  EXTREMITIES: No pedal edema, cyanosis, or clubbing.  NEUROLOGIC: Cranial nerves II through XII are intact. Muscle strength 5/5 in all extremities. Sensation intact. Gait not checked.  PSYCHIATRIC: The patient is demented. SKIN: No obvious rash, lesion, or ulcer.  DATA REVIEW:   CBC Recent Labs  Lab 06/24/17 0820  WBC 12.1*  HGB 12.8  HCT 38.0  PLT 291    Chemistries  Recent Labs  Lab 06/20/17 2348  06/24/17 0701    NA 133*   < > 132*  K 3.9   < > 3.5  CL 101   < > 102  CO2 21*   < > 22  GLUCOSE 129*   < > 134*  BUN 17   < > 21*  CREATININE 0.85   < > 0.62  CALCIUM 8.8*   < > 8.1*  AST 16  --   --   ALT 11*  --   --   ALKPHOS 57  --   --   BILITOT 1.0  --   --    < > = values in this interval not displayed.     Microbiology Results  Results for orders placed or performed during the hospital encounter of 06/20/17  Urine culture     Status: Abnormal   Collection Time: 06/20/17 11:58 PM  Result Value Ref Range Status   Specimen Description URINE, RANDOM  Final   Special Requests NONE  Final   Culture (A)  Final    >=100,000 COLONIES/mL STAPHYLOCOCCUS AUREUS >=100,000 COLONIES/mL AEROCOCCUS VIRIDANS Standardized susceptibility testing for this organism is not available. Performed at [facility], [address] [place], [place] [address]    Report Status 06/24/2017 FINAL  Final   Organism ID, Bacteria STAPHYLOCOCCUS AUREUS (A)  Final      Susceptibility   Staphylococcus aureus - MIC*    CIPROFLOXACIN <=0.5 SENSITIVE Sensitive     GENTAMICIN <=0.5 SENSITIVE Sensitive     NITROFURANTOIN <=16 SENSITIVE Sensitive     OXACILLIN 0.5 SENSITIVE Sensitive     TETRACYCLINE <=1 SENSITIVE Sensitive     VANCOMYCIN <=0.5 SENSITIVE Sensitive     TRIMETH/SULFA <=10 SENSITIVE Sensitive     CLINDAMYCIN <=0.25 SENSITIVE Sensitive     RIFAMPIN <=0.5 SENSITIVE Sensitive     Inducible Clindamycin NEGATIVE Sensitive     * >=100,000 COLONIES/mL STAPHYLOCOCCUS AUREUS    RADIOLOGY:  Ct Head Wo Contrast  Result Date: 06/24/2017 CLINICAL DATA:  Garbled speaking and confusion. Assess for hemorrhagic conversion of RIGHT basal ganglia infarct. EXAM: CT HEAD WITHOUT CONTRAST TECHNIQUE: Contiguous axial images were obtained from the base of the skull through the vertex without intravenous contrast. COMPARISON:  CT HEAD June 23, 2017 FINDINGS: BRAIN: Evolving acute RIGHT basal ganglia infarct without  hemorrhagic transformation. Regional mass effect without midline shift. No intraparenchymal hemorrhage. Old small RIGHT cerebellar infarcts. Patchy supratentorial white matter hypodensities compatible with mild chronic small vessel ischemic disease, less than expected for age. No abnormal extra-axial fluid collections. VASCULAR: Moderate calcific atherosclerosis of the carotid siphons. SKULL: No skull fracture. No significant scalp soft tissue swelling. SINUSES/ORBITS: The mastoid air-cells and included paranasal sinuses are well-aerated.The included ocular globes and orbital contents are non-suspicious. OTHER: None. IMPRESSION: 1. Evolving acute RIGHT basal ganglia infarct without hemorrhagic conversion or significant interval change. 2. Mild chronic small vessel ischemic disease. Old small RIGHT cerebellar infarct. Electronically Signed   By: Awilda Metro M.D.   On: 06/24/2017 06:12   Ct Head Wo Contrast  Result Date: 06/23/2017 CLINICAL DATA:  Altered level of consciousness EXAM: CT HEAD WITHOUT CONTRAST TECHNIQUE: Contiguous axial images were obtained from the base of the skull through the vertex without intravenous contrast. COMPARISON:  06/21/2017 FINDINGS: Brain: Large infarct noted in the right basal ganglia compatible with acute to subacute infarct, similar to prior study. No hemorrhage. No hydrocephalus or midline shift. There is atrophy and chronic small vessel disease changes. Vascular: No hyperdense vessel or unexpected calcification. Skull: No acute calvarial abnormality. Sinuses/Orbits: Visualized paranasal sinuses and mastoids clear. Orbital soft tissues unremarkable. Other: None IMPRESSION: Right basal ganglia infarct again noted, unchanged since prior study. No hemorrhage or new infarct. Atrophy, chronic small vessel disease. Electronically Signed   By: Charlett Nose M.D.   On: 06/23/2017 11:09     Management plans discussed with the patient, family and they are in agreement.  CODE  STATUS: DNR   TOTAL TIME TAKING CARE OF THIS PATIENT: 35 minutes.    Shaune Pollack M.D on 06/24/2017 at 9:55 AM  Between 7am to 6pm - Pager - [phone]  After 6pm go to [email or web address] - Social research officer, government  [facility]  Office  [phone]  CC: Primary care physician; Living, Diamantina Monks Assisted   Note: This dictation was prepared with Dragon dictation along with smaller phrase technology. Any transcriptional errors that result from this process are unintentional.

## 2017-06-24 NOTE — Progress Notes (Signed)
Patient is medically stable for D/C to [facility] today. Per [name] admissions coordinator at [facility] patient can come today to room 328. RN will call report at [phone] and arrange EMS for transport. Clinical Child psychotherapistocial Worker (CSW) sent D/C orders to [facility] via [facility]. Patient's daughter [name] is at bedside and aware of above. Please reconsult if future social work needs arise. CSW signing off.   Baker Hughes IncorporatedBailey Yechezkel Fertig, LCSW [phone]

## 2017-06-24 NOTE — Discharge Instructions (Signed)
Fall and aspiration precaution. Palliative care follow up.

## 2017-06-24 NOTE — Clinical Social Work Placement (Signed)
   CLINICAL SOCIAL WORK PLACEMENT  NOTE  Date:  06/24/2017  Patient Details  Name: Alyssa King MRN: [id number] Date of Birth: 10/24/1920  Clinical Social Work is seeking post-discharge placement for this patient at the Skilled  Nursing Facility level of care (*CSW will initial, date and re-position this form in  chart as items are completed):  Yes   Patient/family provided with  Clinical Social Work Department's list of facilities offering this level of care within the geographic area requested by the patient (or if unable, by the patient's family).  Yes   Patient/family informed of their freedom to choose among providers that offer the needed level of care, that participate in Medicare, Medicaid or managed care program needed by the patient, have an available bed and are willing to accept the patient.  Yes   Patient/family informed of 's ownership interest in [facility] and [facility], as well as of the fact that they are under no obligation to receive care at these facilities.  PASRR submitted to EDS on 06/22/17     PASRR number received on 06/22/17     Existing PASRR number confirmed on       FL2 transmitted to all facilities in geographic area requested by pt/family on 06/22/17     FL2 transmitted to all facilities within larger geographic area on       Patient informed that his/her managed care company has contracts with or will negotiate with certain facilities, including the following:        Yes   Patient/family informed of bed offers received.  Patient chooses bed at [facility] )     Physician recommends and patient chooses bed at      Patient to be transferred to [facility] ) on 06/24/17.  Patient to be transferred to facility by [facility] )     Patient family notified on 06/24/17 of transfer.  Name of family member notified:  (Patient's daughter [name] is at bedside and aware of D/C today. )     PHYSICIAN      Additional Comment:    _______________________________________________ Daisuke , Darleen Crocker M, LCSW 06/24/2017, 11:10 AM

## 2017-06-25 DIAGNOSIS — K219 Gastro-esophageal reflux disease without esophagitis: Secondary | ICD-10-CM | POA: Diagnosis not present

## 2017-06-25 DIAGNOSIS — F39 Unspecified mood [affective] disorder: Secondary | ICD-10-CM

## 2017-06-25 DIAGNOSIS — I10 Essential (primary) hypertension: Secondary | ICD-10-CM | POA: Diagnosis not present

## 2017-06-25 DIAGNOSIS — G8194 Hemiplegia, unspecified affecting left nondominant side: Secondary | ICD-10-CM | POA: Diagnosis not present

## 2017-06-25 DIAGNOSIS — F05 Delirium due to known physiological condition: Secondary | ICD-10-CM | POA: Diagnosis not present

## 2017-06-25 DIAGNOSIS — M79605 Pain in left leg: Secondary | ICD-10-CM | POA: Diagnosis not present

## 2017-07-13 DIAGNOSIS — G8194 Hemiplegia, unspecified affecting left nondominant side: Secondary | ICD-10-CM | POA: Diagnosis not present

## 2017-07-13 DIAGNOSIS — M159 Polyosteoarthritis, unspecified: Secondary | ICD-10-CM | POA: Diagnosis not present

## 2017-07-13 DIAGNOSIS — K219 Gastro-esophageal reflux disease without esophagitis: Secondary | ICD-10-CM

## 2017-07-13 DIAGNOSIS — F321 Major depressive disorder, single episode, moderate: Secondary | ICD-10-CM | POA: Diagnosis not present

## 2017-07-13 DIAGNOSIS — I10 Essential (primary) hypertension: Secondary | ICD-10-CM

## 2017-07-22 DIAGNOSIS — F332 Major depressive disorder, recurrent severe without psychotic features: Secondary | ICD-10-CM | POA: Diagnosis not present

## 2017-07-22 DIAGNOSIS — G8194 Hemiplegia, unspecified affecting left nondominant side: Secondary | ICD-10-CM | POA: Diagnosis not present

## 2017-07-22 DIAGNOSIS — M159 Polyosteoarthritis, unspecified: Secondary | ICD-10-CM | POA: Diagnosis not present

## 2017-07-22 DIAGNOSIS — E441 Mild protein-calorie malnutrition: Secondary | ICD-10-CM | POA: Diagnosis not present

## 2017-07-30 ENCOUNTER — Telehealth: Payer: Self-pay | Admitting: Internal Medicine

## 2017-08-02 ENCOUNTER — Telehealth: Payer: Self-pay

## 2017-08-02 NOTE — Telephone Encounter (Signed)
This was not unexpected

## 2017-08-02 NOTE — Telephone Encounter (Signed)
PLEASE NOTE: All timestamps contained within this report are represented as [place] Standard Time. CONFIDENTIALTY NOTICE: This fax transmission is intended only for the addressee. It contains information that is legally privileged, confidential or otherwise protected from use or disclosure. If you are not the intended recipient, you are strictly prohibited from reviewing, disclosing, copying using or disseminating any of this information or taking any action in reliance on or regarding this information. If you have received this fax in error, please notify us immediately by telephone so that we can arrange for its return to us. Phone: [phone], Toll-Free: [phone], Fax: [phone] Page: 1 of 2 Call Id: [id number] [facility] - Client TELEPHONE ADVICE RECORD [facility] Patient Name: Alyssa King Gender: Female DOB: 07/29/1920 Age: 89 Y 4 M 28 D Return Phone Number: [phone] (Primary) Address: City[place]State/Zip: [place] [id number] Client [facility] - Client [facility] - Night Physician Tillman AbideLetvak, Richard - MD Contact Type Call Who Is Calling Patient / Member / Family / Caregiver Call Type Triage / Clinical Caller Name Dorma Russellaulina Afari Relationship To Patient Care Giver Return Phone Number Please choose phone number Chief Complaint DEATH - has occurred or is imminent or pending Reason for Call Symptomatic / Request for Health Information Initial Comment Caller reporting death of resident. [facility]ident at [facility], phone number [phone] Translation No Nurse Assessment Guidelines Guideline Title Affirmed Question Affirmed Notes Nurse Date/Time (Eastern Time) Disp. Time Lamount Cohen(Eastern Time) Disposition Final User 07/27/2017 8:38:37 PM Send to Urgent Queue Prater, Clydie BraunKaren 08/23/2017 8:50:04 PM Paged On Call back to [facility]ks, RN, Leonette MostCharles 08/21/2017 8:50:25 PM  Call Completed Judithann SheenSparks, RN, Charles 08/17/2017 8:50:36 PM Paged On Call back to [facility], RN, Leonette MostCharles 08/10/2017 8:51:14 PM Paged On Call back to [facility], RN, Leonette MostCharles 07/29/2017 8:51:04 PM Clinical Call Yes Sparks, RN, Charles Comments User: Lyndel Safeharles, Sparks, RN Date/Time Lamount Cohen(Eastern Time): 08/20/2017 9:08:26 PM Dr called back and was given information and call back number ( [phone]) and pt info and POC for NH staff ( Paulina). Dr. Verbalized intent to call back Paging DoctorName Phone DateTime Result/Outcome Message Type Notes Kerby NoraBedsole, [name] - MD [phone] 08/15/2017 8:50:04 PM Paged On Call Back to Call Center Doctor Paged Please call Nurse at [phone] PLEASE NOTE: All timestamps contained within this report are represented as [place] Standard Time. CONFIDENTIALTY NOTICE: This fax transmission is intended only for the addressee. It contains information that is legally privileged, confidential or otherwise protected from use or disclosure. If you are not the intended recipient, you are strictly prohibited from reviewing, disclosing, copying using or disseminating any of this information or taking any action in reliance on or regarding this information. If you have received this fax in error, please notify us immediately by telephone so that we can arrange for its return to us. Phone: [phone], Toll-Free: [phone], Fax: [phone] Page: 2 of 2 Call Id: [id number] Paging DoctorName Phone DateTime Result/Outcome Message Type Notes Kerby NoraBedsole, [name] - MD 08/10/2017 9:10:17 PM Spoke with On Call - General Message Result Dr. returned call. Given PT ID and circumstances. NH phone and POC. States she will call NH.

## 2017-08-27 NOTE — Telephone Encounter (Signed)
Morrie SheldonAshley, RN Hospice Nurse called from [facility] about [facility] patient. She said the patient is actively dying and moaning, she wants to know if the doctor would want to increase her Roxinol, attempted to call the on call physician Dr. Ermalene SearingBedsole twice, left VM for her to call the facility at [phone] and ask for [facility] or the patient's primary nurse.

## 2017-08-27 DEATH — deceased

## 2018-03-22 IMAGING — CT CT HEAD W/O CM
3 series · 15 of 47 positions shown, 18 images · non-contrast
Comparison: 06/21/2017

CLINICAL DATA: Altered level of consciousness

EXAM:
CT HEAD WITHOUT CONTRAST
TECHNIQUE: Contiguous axial images were obtained from the base of the skull
through the vertex without intravenous contrast.

[Series 3: head wo · axial · 0.40mm/px · z∈[+459,+584]mm · 9 of 30 slices shown, 12 images]
[im 3/30  brain]
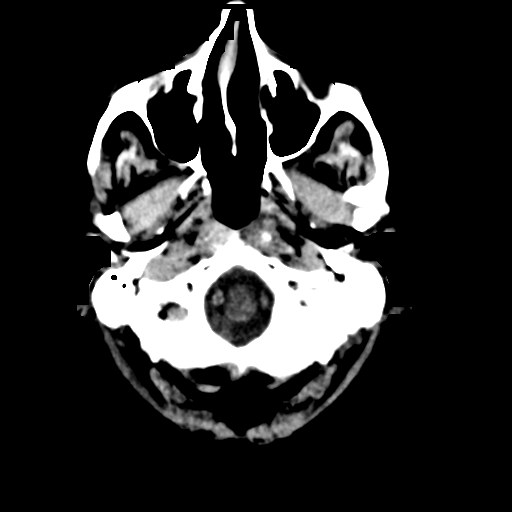
[im 3/30  bone]
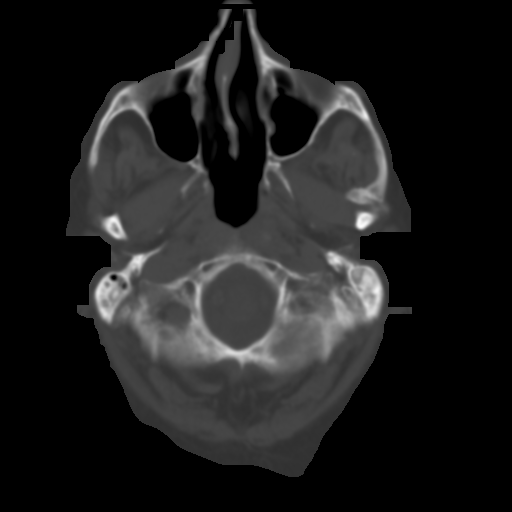
[im 6/30  brain]
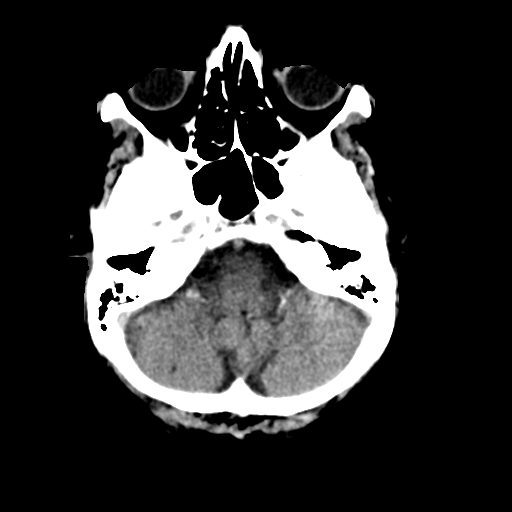
[im 9/30  brain]
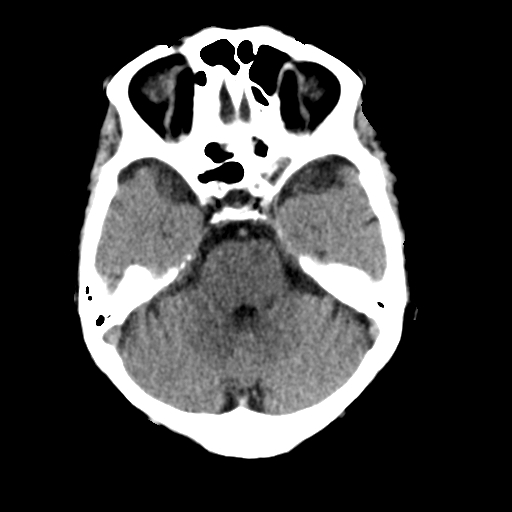
[im 12/30  brain]
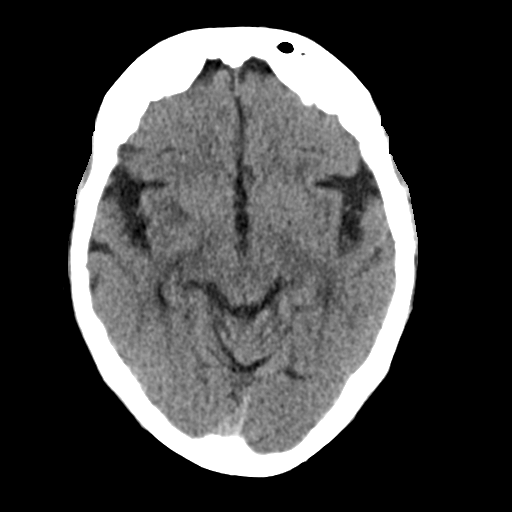
[im 16/30  brain]
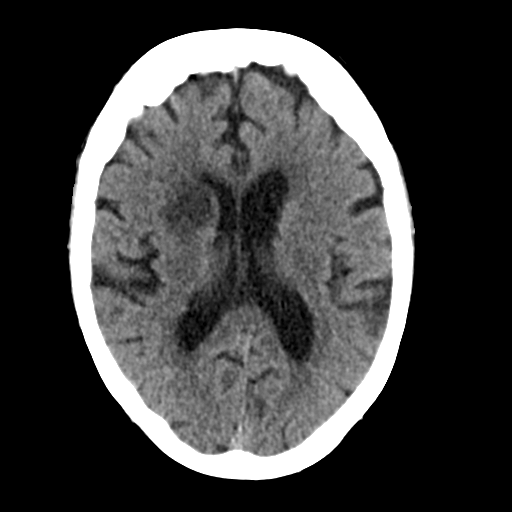
[im 16/30  bone]
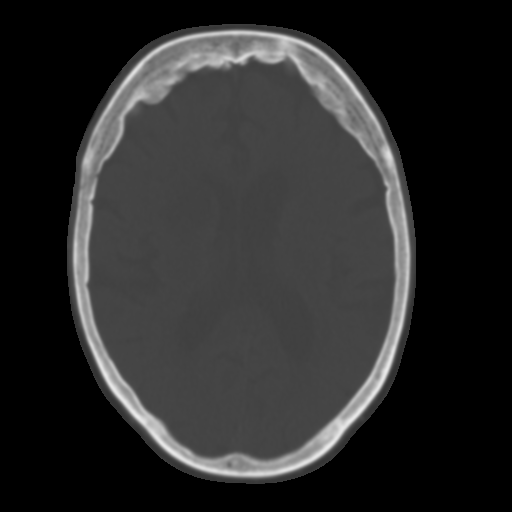
[im 19/30  brain]
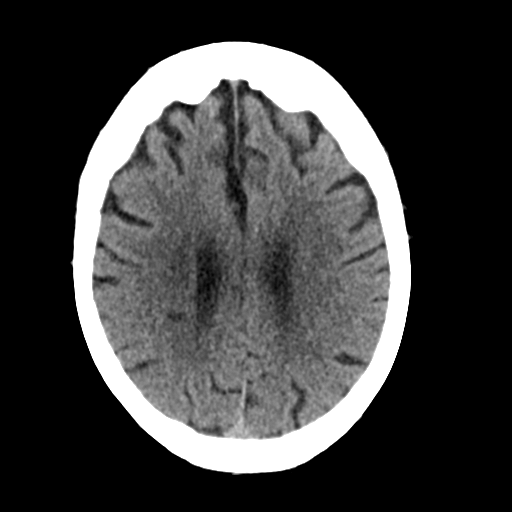
[im 22/30  brain]
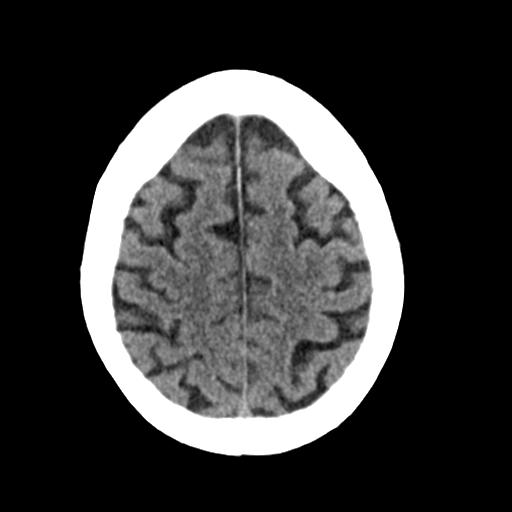
[im 25/30  brain]
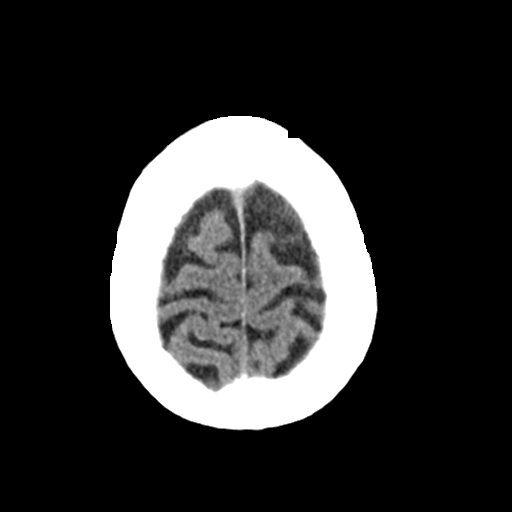
[im 28/30  brain]
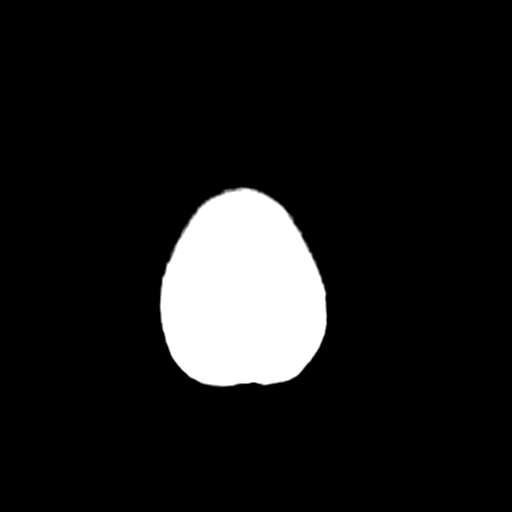
[im 28/30  bone]
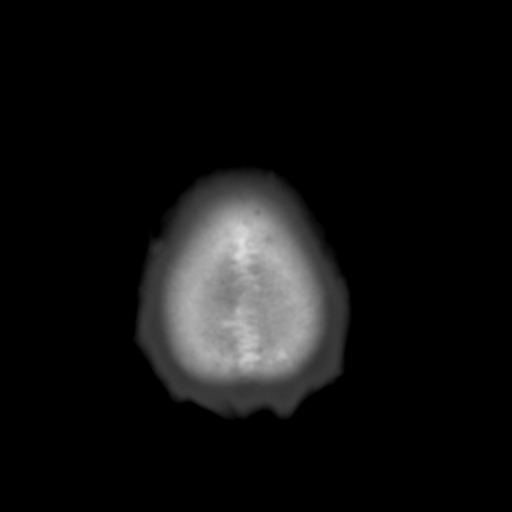

[Series 4: coronal soft tissue · coronal · 0.29mm/px · 3 of 66 slices shown]
[im 22/66  brain]
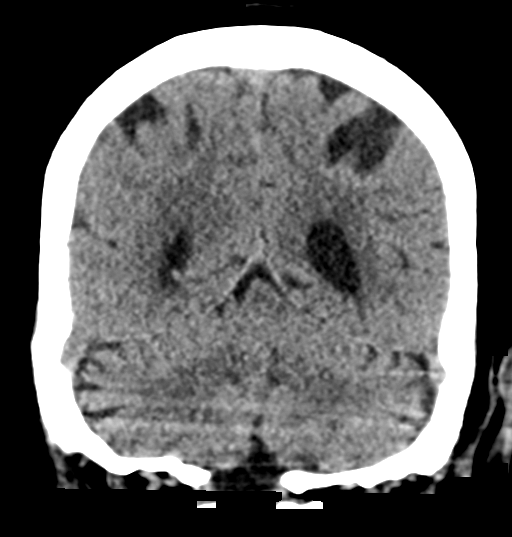
[im 29/66  brain]
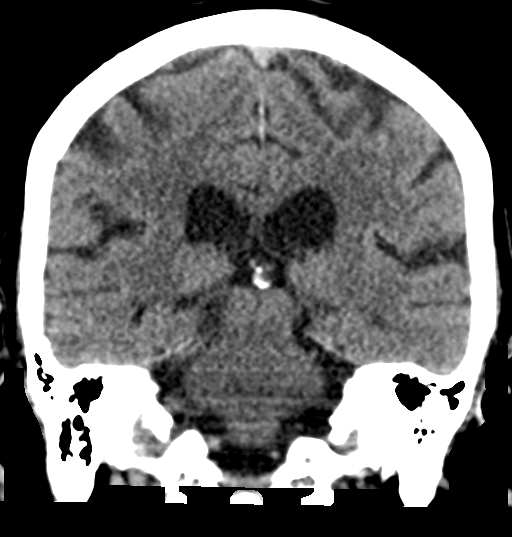
[im 37/66  brain]
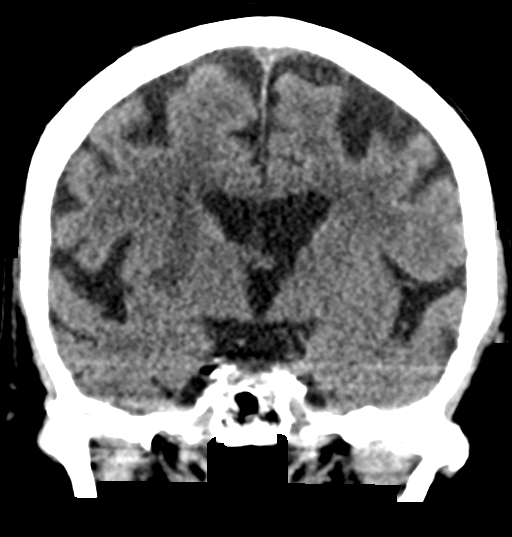

[Series 5: sagittal soft tissue · sagittal · 0.28mm/px · 3 of 48 slices shown]
[im 16/48  brain]
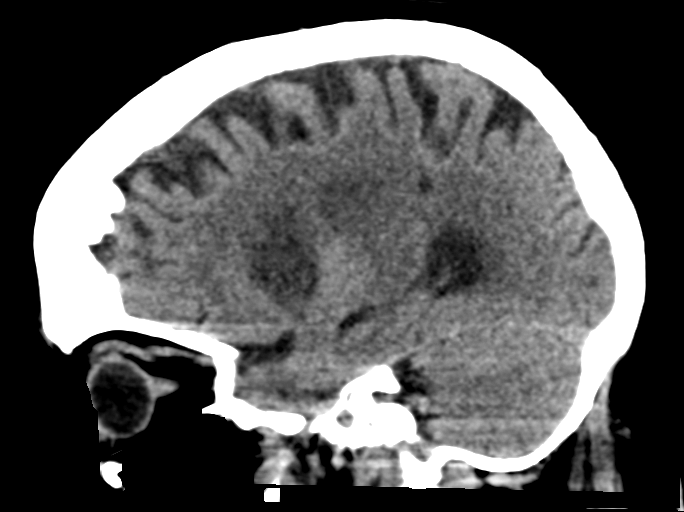
[im 24/48  brain]
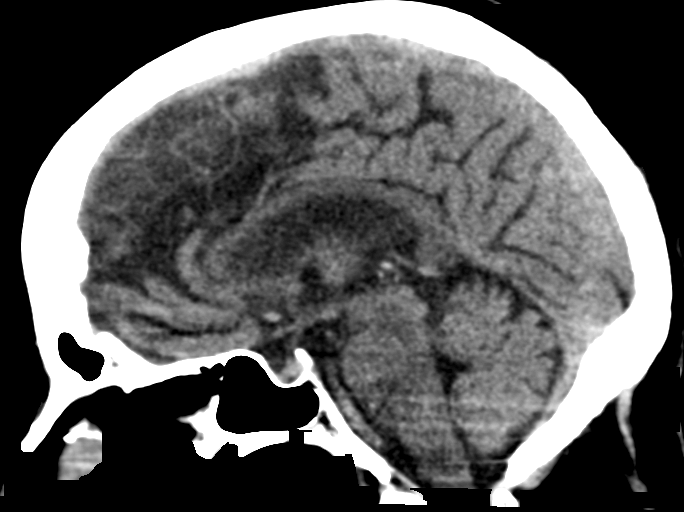
[im 32/48  brain]
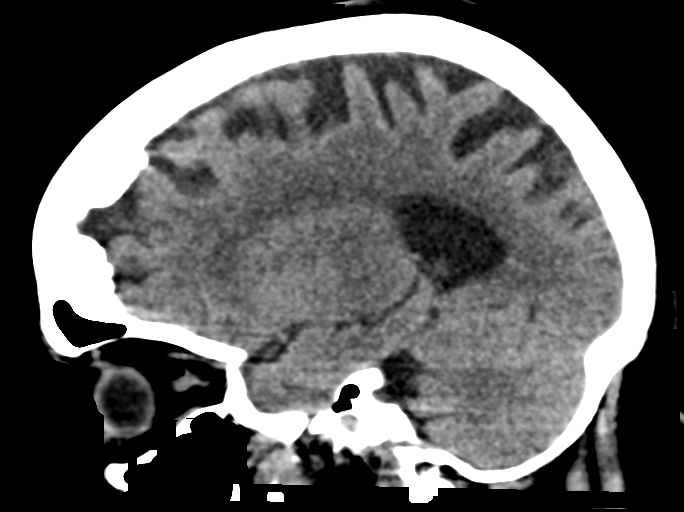

[15 of 47 positions shown; findings below may reference images not displayed]

FINDINGS: Brain: Large infarct noted in the right basal ganglia compatible
with acute to subacute infarct, similar to prior study. No
hemorrhage. No hydrocephalus or midline shift. There is atrophy and
chronic small vessel disease changes.

Vascular: No hyperdense vessel or unexpected calcification.

Skull: No acute calvarial abnormality.

Sinuses/Orbits: Visualized paranasal sinuses and mastoids clear.
Orbital soft tissues unremarkable.

Other: None
IMPRESSION: Right basal ganglia infarct again noted, unchanged since prior
study. No hemorrhage or new infarct.

Atrophy, chronic small vessel disease.
